# Patient Record
Sex: Male | Born: 1955
Health system: Southern US, Community
[De-identification: ages and names within clinical notes are randomized; demographics above are authoritative.]

## PROBLEM LIST (undated history)

## (undated) DIAGNOSIS — C801 Malignant (primary) neoplasm, unspecified: Secondary | ICD-10-CM

## (undated) DIAGNOSIS — F419 Anxiety disorder, unspecified: Secondary | ICD-10-CM

## (undated) DIAGNOSIS — M6740 Ganglion, unspecified site: Secondary | ICD-10-CM

## (undated) DIAGNOSIS — Z87442 Personal history of urinary calculi: Secondary | ICD-10-CM

## (undated) DIAGNOSIS — F32A Depression, unspecified: Secondary | ICD-10-CM

## (undated) DIAGNOSIS — I10 Essential (primary) hypertension: Secondary | ICD-10-CM

## (undated) DIAGNOSIS — F329 Major depressive disorder, single episode, unspecified: Secondary | ICD-10-CM

## (undated) HISTORY — PX: TONSILLECTOMY: SUR1361

## (undated) HISTORY — PX: FOREARM FRACTURE SURGERY: SHX649

## (undated) HISTORY — PX: PROSTATECTOMY: SHX69

## (undated) HISTORY — PX: COLONOSCOPY: SHX174

## (undated) HISTORY — DX: Malignant (primary) neoplasm, unspecified: C80.1

## (undated) HISTORY — PX: SUBACROMIAL DECOMPRESSION: SHX5174

---

## 2005-02-14 ENCOUNTER — Encounter: Admission: RE | Admit: 2005-02-14 | Discharge: 2005-02-14 | Payer: Self-pay | Admitting: Family Medicine

## 2007-06-03 ENCOUNTER — Emergency Department (HOSPITAL_COMMUNITY): Admission: EM | Admit: 2007-06-03 | Discharge: 2007-06-03 | Payer: Self-pay | Admitting: Emergency Medicine

## 2007-08-06 ENCOUNTER — Ambulatory Visit (HOSPITAL_BASED_OUTPATIENT_CLINIC_OR_DEPARTMENT_OTHER): Admission: RE | Admit: 2007-08-06 | Discharge: 2007-08-06 | Payer: Self-pay | Admitting: Urology

## 2007-12-15 ENCOUNTER — Ambulatory Visit: Payer: Self-pay | Admitting: Internal Medicine

## 2007-12-15 DIAGNOSIS — M549 Dorsalgia, unspecified: Secondary | ICD-10-CM | POA: Insufficient documentation

## 2007-12-15 DIAGNOSIS — M4802 Spinal stenosis, cervical region: Secondary | ICD-10-CM | POA: Insufficient documentation

## 2007-12-17 DIAGNOSIS — Z87442 Personal history of urinary calculi: Secondary | ICD-10-CM | POA: Insufficient documentation

## 2007-12-17 DIAGNOSIS — F329 Major depressive disorder, single episode, unspecified: Secondary | ICD-10-CM

## 2007-12-17 DIAGNOSIS — F3289 Other specified depressive episodes: Secondary | ICD-10-CM | POA: Insufficient documentation

## 2007-12-17 DIAGNOSIS — Z860100 Personal history of colon polyps, unspecified: Secondary | ICD-10-CM | POA: Insufficient documentation

## 2007-12-17 DIAGNOSIS — I10 Essential (primary) hypertension: Secondary | ICD-10-CM | POA: Insufficient documentation

## 2008-01-11 ENCOUNTER — Encounter (INDEPENDENT_AMBULATORY_CARE_PROVIDER_SITE_OTHER): Payer: Self-pay | Admitting: *Deleted

## 2008-10-13 ENCOUNTER — Encounter: Admission: RE | Admit: 2008-10-13 | Discharge: 2008-10-13 | Payer: Self-pay | Admitting: Family Medicine

## 2010-10-06 ENCOUNTER — Encounter: Payer: Self-pay | Admitting: Oncology

## 2010-10-06 ENCOUNTER — Encounter: Payer: Self-pay | Admitting: Family Medicine

## 2011-01-28 NOTE — Op Note (Signed)
NAME:  Adrian Mclaughlin, Adrian Mclaughlin               ACCOUNT NO.:  [id number]   MEDICAL RECORD NO.:  [id number]          PATIENT TYPE:  AMB   LOCATION:  [facility]                         FACILITY:  [facility]   PHYSICIAN:  Sigmund I. Patsi Sears, M.D.DATE OF BIRTH:  12-Jan-1956   DATE OF PROCEDURE:  08/06/2007  DATE OF DISCHARGE:                               OPERATIVE REPORT   RESIDENT:  Dr. Allena Katz.   PREOPERATIVE DIAGNOSIS:  Left ureterovesical junction stone.   POSTOPERATIVE DIAGNOSIS:  Left ureterovesical junction stone.   INDICATIONS:  Adrian Mclaughlin is a 55 year old Caucasian gentleman with a  history of left-sided urolithiasis. CT scan showed approximately 4-mm  left distal UVJ stone. Due to persistent symptoms and not passing the  stone, he is taken back electively today for stone management.   PROCEDURES:  1. Pancystourethroscopy.  2. Retrograde pyelogram on the left.  3. Left extraction of stone.   PROCEDURE IN DETAIL:  The patient is brought back into the operating  room, and after the successful induction of general endotracheal  anesthesia, he was prepped and draped in the usual sterile fashion.  Preoperative time out was performed, and all pressure points were padded  appropriately. We then used a 22-French sheath with 30- and 70-degree  lenses to do pancystourethroscopy. The patient's urethra appeared  normal. There was no evidence of stricture, tumor, foreign body or any  other abnormality. On entering the patient's bladder, both ureteral  orifices were seen at the later aspect of the trigone effluxing clear  urine. The bladder itself appeared unremarkable without any evidence of  tumor, stone, foreign body or any other abnormality. At this point, the  left ureteral orifice was cannulated with a 6-French end-hole catheter.  A retrograde pyelogram was shot. A distal filling defect was seen  consistent with this stone location on CT scan. We then placed a safety  Glidewire in. Then, using the  ureteroscope, we identified the stone. We  were able to grasp it with a nitinol basket. It was then removed in its  entirety from the ureter with some minimal manipulation. After the stone  was withdrawn, it was sent for stone analysis. Then, using a cystoscope,  we were examined the patient's urethra and bladder. As it continued to  appear normal, the Glidewire was removed, the bladder was emptied, and  this ended the procedure.   Dr. Lynelle Smoke I. Patsi Sears was present throughout the entirety of the  case.   ESTIMATED BLOOD LOSS:  Minimal.   URINE OUTPUT:  Unrecorded.   DRAINS:  None.   DISPOSITION:  The patient will go to the PACU for further postoperative  care.      Terie Purser, MD      Sigmund I. Patsi Sears, M.D.  Electronically Signed    JH/MEDQ  D:  08/06/2007  T:  08/07/2007  Job:  [id number]

## 2011-06-24 LAB — I-STAT 8, (EC8 V) (CONVERTED LAB)
Acid-Base Excess: 2
Bicarbonate: 30.6 — ABNORMAL HIGH
Chloride: 104
HCT: 51
Hemoglobin: 17.3 — ABNORMAL HIGH
Operator id: 268271
Sodium: 141
TCO2: 33

## 2011-06-26 LAB — CBC
HCT: 41.2
Hemoglobin: 14.4
Platelets: 217
RBC: 4.92

## 2011-06-26 LAB — BASIC METABOLIC PANEL
CO2: 30
Chloride: 102
Creatinine, Ser: 0.99
GFR calc non Af Amer: >60
Glucose, Bld: 104 — ABNORMAL HIGH
Potassium: 4.6
Sodium: 140

## 2011-06-26 LAB — POCT CARDIAC MARKERS
CKMB, poc: 1.1
Troponin i, poc: <0.05

## 2011-06-26 LAB — DIFFERENTIAL
Eosinophils Absolute: 0
Eosinophils Relative: 0
Monocytes Absolute: 0.4
Monocytes Relative: 4

## 2011-12-25 ENCOUNTER — Other Ambulatory Visit: Payer: Self-pay | Admitting: Gastroenterology

## 2011-12-25 DIAGNOSIS — R110 Nausea: Secondary | ICD-10-CM

## 2011-12-25 DIAGNOSIS — R634 Abnormal weight loss: Secondary | ICD-10-CM

## 2011-12-26 ENCOUNTER — Ambulatory Visit
Admission: RE | Admit: 2011-12-26 | Discharge: 2011-12-26 | Disposition: A | Discharge status: Home / Self Care | Payer: BC Managed Care – PPO | Source: Ambulatory Visit | Attending: Gastroenterology | Admitting: Gastroenterology

## 2011-12-26 DIAGNOSIS — R634 Abnormal weight loss: Secondary | ICD-10-CM

## 2011-12-26 DIAGNOSIS — R110 Nausea: Secondary | ICD-10-CM

## 2012-01-13 ENCOUNTER — Other Ambulatory Visit (HOSPITAL_COMMUNITY): Payer: Self-pay | Admitting: Gastroenterology

## 2012-01-13 DIAGNOSIS — R110 Nausea: Secondary | ICD-10-CM

## 2012-01-19 ENCOUNTER — Other Ambulatory Visit (HOSPITAL_COMMUNITY): Payer: BC Managed Care – PPO

## 2014-11-13 ENCOUNTER — Other Ambulatory Visit: Payer: Self-pay | Admitting: Family Medicine

## 2014-11-13 DIAGNOSIS — M4802 Spinal stenosis, cervical region: Secondary | ICD-10-CM

## 2014-11-17 ENCOUNTER — Ambulatory Visit
Admission: RE | Admit: 2014-11-17 | Discharge: 2014-11-17 | Disposition: A | Discharge status: Home / Self Care | Payer: 59 | Source: Ambulatory Visit | Attending: Family Medicine | Admitting: Family Medicine

## 2014-11-17 DIAGNOSIS — M4802 Spinal stenosis, cervical region: Secondary | ICD-10-CM

## 2016-03-13 ENCOUNTER — Other Ambulatory Visit: Payer: Self-pay | Admitting: Orthopedic Surgery

## 2016-04-09 ENCOUNTER — Encounter (HOSPITAL_BASED_OUTPATIENT_CLINIC_OR_DEPARTMENT_OTHER): Payer: Self-pay | Admitting: *Deleted

## 2016-04-15 ENCOUNTER — Encounter (HOSPITAL_BASED_OUTPATIENT_CLINIC_OR_DEPARTMENT_OTHER): Payer: Self-pay | Admitting: Certified Registered"

## 2016-04-15 ENCOUNTER — Ambulatory Visit (HOSPITAL_BASED_OUTPATIENT_CLINIC_OR_DEPARTMENT_OTHER): Payer: 59 | Admitting: Certified Registered"

## 2016-04-15 ENCOUNTER — Encounter (HOSPITAL_BASED_OUTPATIENT_CLINIC_OR_DEPARTMENT_OTHER)
Admission: RE | Disposition: A | Discharge status: Home / Self Care | Payer: Self-pay | Source: Ambulatory Visit | Attending: Orthopedic Surgery

## 2016-04-15 ENCOUNTER — Ambulatory Visit (HOSPITAL_BASED_OUTPATIENT_CLINIC_OR_DEPARTMENT_OTHER)
Admission: RE | Admit: 2016-04-15 | Discharge: 2016-04-15 | Disposition: A | Discharge status: Home / Self Care | Payer: 59 | Source: Ambulatory Visit | Attending: Orthopedic Surgery | Admitting: Orthopedic Surgery

## 2016-04-15 DIAGNOSIS — M19042 Primary osteoarthritis, left hand: Secondary | ICD-10-CM | POA: Diagnosis not present

## 2016-04-15 DIAGNOSIS — M7138 Other bursal cyst, other site: Secondary | ICD-10-CM | POA: Insufficient documentation

## 2016-04-15 DIAGNOSIS — M6740 Ganglion, unspecified site: Secondary | ICD-10-CM | POA: Diagnosis not present

## 2016-04-15 DIAGNOSIS — F329 Major depressive disorder, single episode, unspecified: Secondary | ICD-10-CM | POA: Insufficient documentation

## 2016-04-15 DIAGNOSIS — F419 Anxiety disorder, unspecified: Secondary | ICD-10-CM | POA: Diagnosis not present

## 2016-04-15 DIAGNOSIS — I10 Essential (primary) hypertension: Secondary | ICD-10-CM | POA: Diagnosis not present

## 2016-04-15 HISTORY — DX: Anxiety disorder, unspecified: F41.9

## 2016-04-15 HISTORY — DX: Major depressive disorder, single episode, unspecified: F32.9

## 2016-04-15 HISTORY — PX: MASS EXCISION: SHX2000

## 2016-04-15 HISTORY — DX: Ganglion, unspecified site: M67.40

## 2016-04-15 HISTORY — DX: Essential (primary) hypertension: I10

## 2016-04-15 HISTORY — DX: Depression, unspecified: F32.A

## 2016-04-15 LAB — POCT I-STAT, CHEM 8
BUN: 21 mg/dL — AB (ref 6–20)
CHLORIDE: 103 mmol/L (ref 101–111)
CREATININE: 0.9 mg/dL (ref 0.61–1.24)
Calcium, Ion: 1.28 mmol/L — ABNORMAL HIGH (ref 1.12–1.23)
GLUCOSE: 92 mg/dL (ref 65–99)
HCT: 38 % — ABNORMAL LOW (ref 39.0–52.0)
HEMOGLOBIN: 12.9 g/dL — AB (ref 13.0–17.0)
POTASSIUM: 4.4 mmol/L (ref 3.5–5.1)
Sodium: 140 mmol/L (ref 135–145)
TCO2: 26 mmol/L (ref 0–100)

## 2016-04-15 SURGERY — EXCISION MASS
Anesthesia: General | Site: Finger | Laterality: Left

## 2016-04-15 MED ORDER — MIDAZOLAM HCL 2 MG/2ML IJ SOLN
1.0000 mg | INTRAMUSCULAR | Status: DC | PRN
  Administered 2016-04-15: 2 mg via INTRAVENOUS

## 2016-04-15 MED ORDER — LIDOCAINE HCL (CARDIAC) 20 MG/ML IV SOLN
INTRAVENOUS | Status: DC | PRN
  Administered 2016-04-15: 60 mg via INTRAVENOUS

## 2016-04-15 MED ORDER — ONDANSETRON HCL 4 MG/2ML IJ SOLN
INTRAMUSCULAR | Status: DC | PRN
  Administered 2016-04-15: 4 mg via INTRAVENOUS

## 2016-04-15 MED ORDER — BUPIVACAINE HCL (PF) 0.25 % IJ SOLN
INTRAMUSCULAR | Status: DC | PRN
  Administered 2016-04-15: 10 mL

## 2016-04-15 MED ORDER — OXYCODONE-ACETAMINOPHEN 5-325 MG PO TABS: ORAL_TABLET | ORAL | 0 refills | Status: DC

## 2016-04-15 MED ORDER — SCOPOLAMINE 1 MG/3DAYS TD PT72: 1.0000 | MEDICATED_PATCH | Freq: Once | TRANSDERMAL | Status: DC | PRN

## 2016-04-15 MED ORDER — LACTATED RINGERS IV SOLN
INTRAVENOUS | Status: DC
  Administered 2016-04-15 (×2): via INTRAVENOUS

## 2016-04-15 MED ORDER — MIDAZOLAM HCL 2 MG/2ML IJ SOLN
INTRAMUSCULAR | Status: AC
  Filled 2016-04-15: qty 2

## 2016-04-15 MED ORDER — DEXAMETHASONE SODIUM PHOSPHATE 10 MG/ML IJ SOLN
INTRAMUSCULAR | Status: DC | PRN
  Administered 2016-04-15: 10 mg via INTRAVENOUS

## 2016-04-15 MED ORDER — CEFAZOLIN SODIUM-DEXTROSE 2-4 GM/100ML-% IV SOLN
2.0000 g | INTRAVENOUS | Status: AC
  Administered 2016-04-15: 2 g via INTRAVENOUS

## 2016-04-15 MED ORDER — CEFAZOLIN SODIUM-DEXTROSE 2-4 GM/100ML-% IV SOLN
INTRAVENOUS | Status: AC
  Filled 2016-04-15: qty 100

## 2016-04-15 MED ORDER — PROPOFOL 10 MG/ML IV BOLUS
INTRAVENOUS | Status: DC | PRN
  Administered 2016-04-15: 200 mg via INTRAVENOUS

## 2016-04-15 MED ORDER — FENTANYL CITRATE (PF) 100 MCG/2ML IJ SOLN
INTRAMUSCULAR | Status: AC
  Filled 2016-04-15: qty 2

## 2016-04-15 MED ORDER — FENTANYL CITRATE (PF) 100 MCG/2ML IJ SOLN
50.0000 ug | INTRAMUSCULAR | Status: DC | PRN
  Administered 2016-04-15 (×2): 50 ug via INTRAVENOUS

## 2016-04-15 MED ORDER — GLYCOPYRROLATE 0.2 MG/ML IJ SOLN: 0.2000 mg | Freq: Once | INTRAMUSCULAR | Status: DC | PRN

## 2016-04-15 MED ORDER — CHLORHEXIDINE GLUCONATE 4 % EX LIQD: 60.0000 mL | Freq: Once | CUTANEOUS | Status: DC

## 2016-04-15 SURGICAL SUPPLY — 64 items
APL SKNCLS STERI-STRIP NONHPOA (GAUZE/BANDAGES/DRESSINGS)
BAG DECANTER FOR FLEXI CONT (MISCELLANEOUS) IMPLANT
BANDAGE ACE 3X5.8 VEL STRL LF (GAUZE/BANDAGES/DRESSINGS) IMPLANT
BANDAGE COBAN STERILE 2 (GAUZE/BANDAGES/DRESSINGS) IMPLANT
BENZOIN TINCTURE PRP APPL 2/3 (GAUZE/BANDAGES/DRESSINGS) IMPLANT
BLADE MINI RND TIP GREEN BEAV (BLADE) IMPLANT
BLADE SURG 15 STRL LF DISP TIS (BLADE) ×4 IMPLANT
BLADE SURG 15 STRL SS (BLADE) ×8
BNDG CMPR 9X4 STRL LF SNTH (GAUZE/BANDAGES/DRESSINGS) ×2
BNDG COHESIVE 1X5 TAN STRL LF (GAUZE/BANDAGES/DRESSINGS) IMPLANT
BNDG CONFORM 2 STRL LF (GAUZE/BANDAGES/DRESSINGS) IMPLANT
BNDG ELASTIC 2X5.8 VLCR STR LF (GAUZE/BANDAGES/DRESSINGS) IMPLANT
BNDG ESMARK 4X9 LF (GAUZE/BANDAGES/DRESSINGS) ×4 IMPLANT
BNDG GAUZE 1X2.1 STRL (MISCELLANEOUS) IMPLANT
BNDG GAUZE ELAST 4 BULKY (GAUZE/BANDAGES/DRESSINGS) IMPLANT
BNDG PLASTER X FAST 3X3 WHT LF (CAST SUPPLIES) IMPLANT
BNDG PLSTR 9X3 FST ST WHT (CAST SUPPLIES)
CHLORAPREP W/TINT 26ML (MISCELLANEOUS) ×4 IMPLANT
CLOSURE WOUND 1/2 X4 (GAUZE/BANDAGES/DRESSINGS)
CORDS BIPOLAR (ELECTRODE) ×4 IMPLANT
COVER BACK TABLE 60X90IN (DRAPES) ×4 IMPLANT
COVER MAYO STAND STRL (DRAPES) ×4 IMPLANT
CUFF TOURNIQUET SINGLE 18IN (TOURNIQUET CUFF) ×4 IMPLANT
DRAPE EXTREMITY T 121X128X90 (DRAPE) ×4 IMPLANT
DRAPE SURG 17X23 STRL (DRAPES) ×4 IMPLANT
GAUZE PACKING IODOFORM 1/4X15 (GAUZE/BANDAGES/DRESSINGS) IMPLANT
GAUZE SPONGE 4X4 12PLY STRL (GAUZE/BANDAGES/DRESSINGS) ×4 IMPLANT
GAUZE XEROFORM 1X8 LF (GAUZE/BANDAGES/DRESSINGS) ×4 IMPLANT
GLOVE BIO SURGEON STRL SZ7.5 (GLOVE) ×7 IMPLANT
GLOVE BIOGEL PI IND STRL 7.0 (GLOVE) ×4 IMPLANT
GLOVE BIOGEL PI IND STRL 8 (GLOVE) ×3 IMPLANT
GLOVE BIOGEL PI INDICATOR 7.0 (GLOVE) ×4
GLOVE BIOGEL PI INDICATOR 8 (GLOVE) ×4
GOWN STRL REUS W/ TWL LRG LVL3 (GOWN DISPOSABLE) ×3 IMPLANT
GOWN STRL REUS W/TWL LRG LVL3 (GOWN DISPOSABLE) ×8
GOWN STRL REUS W/TWL XL LVL3 (GOWN DISPOSABLE) ×7 IMPLANT
LOOP VESSEL MAXI BLUE (MISCELLANEOUS) IMPLANT
NDL HYPO 25X1 1.5 SAFETY (NEEDLE) ×1 IMPLANT
NEEDLE HYPO 25X1 1.5 SAFETY (NEEDLE) ×4 IMPLANT
NS IRRIG 1000ML POUR BTL (IV SOLUTION) ×4 IMPLANT
PACK BASIN DAY SURGERY FS (CUSTOM PROCEDURE TRAY) ×4 IMPLANT
PAD CAST 3X4 CTTN HI CHSV (CAST SUPPLIES) IMPLANT
PAD CAST 4YDX4 CTTN HI CHSV (CAST SUPPLIES) IMPLANT
PADDING CAST ABS 4INX4YD NS (CAST SUPPLIES) ×2
PADDING CAST ABS COTTON 4X4 ST (CAST SUPPLIES) ×2 IMPLANT
PADDING CAST COTTON 3X4 STRL (CAST SUPPLIES)
PADDING CAST COTTON 4X4 STRL (CAST SUPPLIES)
SPLINT FINGER 3.25 911903 (SOFTGOODS) ×3 IMPLANT
SPLINT PLASTER CAST XFAST 3X15 (CAST SUPPLIES) IMPLANT
SPLINT PLASTER XTRA FASTSET 3X (CAST SUPPLIES)
STOCKINETTE 4X48 STRL (DRAPES) ×4 IMPLANT
STRIP CLOSURE SKIN 1/2X4 (GAUZE/BANDAGES/DRESSINGS) IMPLANT
SUT ETHILON 3 0 PS 1 (SUTURE) IMPLANT
SUT ETHILON 4 0 PS 2 18 (SUTURE) ×4 IMPLANT
SUT ETHILON 5 0 P 3 18 (SUTURE)
SUT NYLON ETHILON 5-0 P-3 1X18 (SUTURE) IMPLANT
SUT VIC AB 4-0 P2 18 (SUTURE) IMPLANT
SWAB COLLECTION DEVICE MRSA (MISCELLANEOUS) IMPLANT
SWAB CULTURE ESWAB REG 1ML (MISCELLANEOUS) IMPLANT
SYR BULB 3OZ (MISCELLANEOUS) ×4 IMPLANT
SYR CONTROL 10ML LL (SYRINGE) ×4 IMPLANT
TOWEL OR 17X24 6PK STRL BLUE (TOWEL DISPOSABLE) ×8 IMPLANT
TUBE FEEDING 5FR 15 INCH (TUBING) IMPLANT
UNDERPAD 30X30 (UNDERPADS AND DIAPERS) ×4 IMPLANT

## 2016-04-15 NOTE — Anesthesia Preprocedure Evaluation (Addendum)
Anesthesia Evaluation  Patient identified by MRN, date of birth, ID band Patient awake    Reviewed: Allergy & Precautions, NPO status , Patient's Chart, lab work & pertinent test results  Airway Mallampati: I  TM Distance: >3 FB Neck ROM: Full    Dental  (+) Teeth Intact, Dental Advisory Given   Pulmonary    breath sounds clear to auscultation       Cardiovascular hypertension, Pt. on medications  Rhythm:Regular Rate:Normal     Neuro/Psych PSYCHIATRIC DISORDERS Anxiety Depression    GI/Hepatic   Endo/Other    Renal/GU      Musculoskeletal   Abdominal   Peds  Hematology   Anesthesia Other Findings   Reproductive/Obstetrics                            Anesthesia Physical Anesthesia Plan  ASA: II  Anesthesia Plan: General   Post-op Pain Management:    Induction: Intravenous  Airway Management Planned: LMA  Additional Equipment:   Intra-op Plan:   Post-operative Plan: Extubation in OR  Informed Consent: I have reviewed the patients History and Physical, chart, labs and discussed the procedure including the risks, benefits and alternatives for the proposed anesthesia with the patient or authorized representative who has indicated his/her understanding and acceptance.   Dental advisory given  Plan Discussed with: CRNA, Anesthesiologist and Surgeon  Anesthesia Plan Comments:         Anesthesia Quick Evaluation

## 2016-04-15 NOTE — Discharge Instructions (Addendum)

## 2016-04-15 NOTE — H&P (Signed)
  Adrian Mclaughlin is an 60 y.o. male.   Chief Complaint: left long finger mucoid cyst HPI: 60 yo rhd male with 4 month history of mass on left long finger.  It is bothersome to him.  He wishes to have it removed.    Allergies: No Known Allergies  Past Medical History:  Diagnosis Date  . Anxiety   . Depression   . Hypertension   . Mucoid cyst of joint    left long finger    Past Surgical History:  Procedure Laterality Date  . FOREARM FRACTURE SURGERY Left   . TONSILLECTOMY      Family History: History reviewed. No pertinent family history.  Social History:   reports that he has never smoked. He has never used smokeless tobacco. He reports that he drinks alcohol. His drug history is not on file.  Medications: Medications Prior to Admission  Medication Sig Dispense Refill  . lamoTRIgine (LAMICTAL) 150 MG tablet Take 150 mg by mouth daily.    Marland Kitchen PARoxetine (PAXIL) 40 MG tablet Take 40 mg by mouth every morning.    Marland Kitchen QUEtiapine (SEROQUEL) 200 MG tablet Take 200 mg by mouth at bedtime.    . quinapril-hydrochlorothiazide (ACCURETIC) 20-12.5 MG tablet Take 1 tablet by mouth daily.      Results for orders placed or performed during the hospital encounter of 04/15/16 (from the past 48 hour(s))  I-STAT, chem 8     Status: Abnormal   Collection Time: 04/15/16 11:39 AM  Result Value Ref Range   Sodium 140 135 - 145 mmol/L   Potassium 4.4 3.5 - 5.1 mmol/L   Chloride 103 101 - 111 mmol/L   BUN 21 (H) 6 - 20 mg/dL   Creatinine, Ser 0.90 0.61 - 1.24 mg/dL   Glucose, Bld 92 65 - 99 mg/dL   Calcium, Ion 1.28 (H) 1.12 - 1.23 mmol/L   TCO2 26 0 - 100 mmol/L   Hemoglobin 12.9 (L) 13.0 - 17.0 g/dL   HCT 38.0 (L) 39.0 - 52.0 %    No results found.   A comprehensive review of systems was negative.  Blood pressure 127/72, pulse 70, temperature 97.8 F (36.6 C), temperature source Oral, resp. rate 18, height 5\' 11"  (1.803 m), weight 82.1 kg (181 lb), SpO2 100 %.  General appearance:  alert, cooperative and appears stated age Head: Normocephalic, without obvious abnormality, atraumatic Neck: supple, symmetrical, trachea midline Resp: clear to auscultation bilaterally Cardio: regular rate and rhythm GI: non-tender Extremities: Intact sensation and capillary refill all digits.  +epl/fpl/io.  No wounds.  Pulses: 2+ and symmetric Skin: Skin color, texture, turgor normal. No rashes or lesions Neurologic: Grossly normal Incision/Wound:none  Assessment/Plan Left long finger mucoid cyst and dip arthritis.  Non operative and operative treatment options were discussed with the patient and patient wishes to proceed with operative treatment.  We discussed the possible need for a rotation flap for skin coverage.  Risks, benefits, and alternatives of surgery were discussed and the patient agrees with the plan of care.  Cristianna Cyr R 04/15/2016, 12:42 PM

## 2016-04-15 NOTE — Op Note (Signed)
[id number] 

## 2016-04-15 NOTE — Op Note (Signed)
NAME:  Adrian Mclaughlin, Adrian Mclaughlin NO.:  [id number]  MEDICAL RECORD NO.:  [id number]  LOCATION:                                 FACILITY:  PHYSICIAN:  Leanora Cover, MD             DATE OF BIRTH:  DATE OF PROCEDURE:  04/15/2016 DATE OF DISCHARGE:                              OPERATIVE REPORT   PREOPERATIVE DIAGNOSES:  Left long finger mucoid cyst and DIP arthritis.  POSTOPERATIVE DIAGNOSES:  Left long finger mucoid cyst, DIP joint arthritis and loose bodies.  PROCEDURE:   1. Left long finger excision of mucoid cyst 2. Debridement of DIP joint including removal of 6 loose bodies and removal of osteophyte.  SURGEON:  Leanora Cover, MD.  ASSISTANT:  None.  ANESTHESIA:  General.  IV FLUIDS:  Per anesthesia flow sheet.  ESTIMATED BLOOD LOSS:  Minimal.  COMPLICATIONS:  None.  SPECIMENS:  Cyst to Pathology.  TOURNIQUET TIME:  19 minutes.  DISPOSITION:  Stable to PACU.  INDICATIONS:  Adrian Mclaughlin is a 60 year old male with a mass in his left long finger for approximately 4 months.  This is bothersome to him.  He wished to have it removed.  Risks, benefits, and alternatives of surgery were discussed including risk of blood loss; infection; damage to nerves, vessels, tendons, ligaments, bone; failure of surgery; need for additional surgery; complications with wound healing; continued pain; recurrence of mass.  He voiced understanding of these risks and elected to proceed.  OPERATIVE COURSE:  After being identified preoperatively by myself, the patient and I agreed upon procedure and site of procedure.  Surgical site was marked.  The risks, benefits, and alternatives of surgery were reviewed and he wished to proceed.  Surgical consent had been signed. He was given IV Ancef as preoperative antibiotic prophylaxis.  The patient was transferred to the operating room, placed on the operating room table in supine position with left upper extremity on arm board. General  anesthesia was induced by anesthesiologist.  Left upper extremity was prepped and draped in normal sterile orthopedic fashion. Surgical pause was performed between surgeons, anesthesia, operating staff, and all were in agreement as to the patient, procedure, and site of procedure.  Tourniquet at the proximal aspect of the extremity was inflated to 250 mmHg after exsanguination of the limb with an Esmarch bandage.  A hockey-stick shaped incision was made at the radial side of the finger at the DIP joint.  This was carried into subcutaneous tissues by spreading technique.  Bipolar electrocautery was used as necessary for hemostasis.  The mass was removed and sent to Pathology for examination.  The DIP joint was entered underneath the extensor tendon. It was debrided with the synovectomy rongeurs.  Prominent osteophyte dorsally was taken down.  There were approximately 6 well-rounded loose bodies in the joint, which were also removed.  The wound and joint were copiously irrigated with sterile saline.  The thin skin on the dorsum of the finger, where the mass was excised in elliptical fashion. Approximately 1-2 mm of skin was excised in a smaller diameter.  A 5-0 nylon suture was then used to reapproximate all skin edges.  Good reapproximation was obtained.  A digital block was performed with 10 mL of 0.25% plain Marcaine to aid in postoperative analgesia.  The wound was dressed with sterile Xeroform, 4 x 4 and wrapped with a Coban dressing lightly.  An AlumaFoam splint was placed and wrapped with Coban dressing lightly.  Tourniquet deflated at 19 minutes.  Fingertips were pink with brisk capillary refill after deflation of tourniquet. Operative drapes were broken down.  The patient was awoken from anesthesia safely.  He was transferred back to stretcher and taken to PACU in stable condition.  I will see him back in the office in 1 week for postoperative followup.  I will give him Percocet  5/325, 1-2 p.o. q.6 hours p.r.n. pain, dispensed #20.     Leanora Cover, MD     KK/MEDQ  D:  04/15/2016  T:  04/15/2016  Job:  [id number]

## 2016-04-15 NOTE — Anesthesia Procedure Notes (Signed)
Procedure Name: LMA Insertion Date/Time: 04/15/2016 12:55 PM Performed by: Kyzen Horn D Pre-anesthesia Checklist: Patient identified, Emergency Drugs available, Suction available and Patient being monitored Patient Re-evaluated:Patient Re-evaluated prior to inductionOxygen Delivery Method: Circle system utilized Preoxygenation: Pre-oxygenation with 100% oxygen Intubation Type: IV induction Ventilation: Mask ventilation without difficulty LMA: LMA inserted LMA Size: 5.0 Number of attempts: 1 Airway Equipment and Method: Bite block Placement Confirmation: positive ETCO2 Tube secured with: Tape Dental Injury: Teeth and Oropharynx as per pre-operative assessment

## 2016-04-15 NOTE — Transfer of Care (Signed)
Immediate Anesthesia Transfer of Care Note  Patient: Adrian Mclaughlin  Procedure(s) Performed: Procedure(s): LEFT LONG FINGER EXCISION MASS (Left)  Patient Location: PACU  Anesthesia Type:General  Level of Consciousness: awake and patient cooperative  Airway & Oxygen Therapy: Patient Spontanous Breathing and Patient connected to face mask oxygen  Post-op Assessment: Report given to RN and Post -op Vital signs reviewed and stable  Post vital signs: Reviewed and stable  Last Vitals:  Vitals:   04/15/16 1126 04/15/16 1332  BP: 127/72 99/63  Pulse: 70 72  Resp: 18   Temp: 36.6 C     Last Pain:  Vitals:   04/15/16 1126  TempSrc: Oral  PainSc: 4       Patients Stated Pain Goal: 1 ([id number])  Complications: No apparent anesthesia complications

## 2016-04-15 NOTE — Brief Op Note (Signed)
04/15/2016  1:39 PM  PATIENT:  Adrian Mclaughlin  60 y.o. male  PRE-OPERATIVE DIAGNOSIS:  left long finger mucoid cyst and Distal Interphalangeal Arthritis    M67.40  POST-OPERATIVE DIAGNOSIS:  left long finger mucoid cyst and distal interphangeal arthritis  PROCEDURE:  Procedure(s): LEFT LONG FINGER EXCISION MASS (Left)  SURGEON:  Surgeon(s) and Role:    * Leanora Cover, MD - Primary  PHYSICIAN ASSISTANT:   ASSISTANTS: none   ANESTHESIA:   general  EBL:  Total I/O In: 1000 [I.V.:1000] Out: -   BLOOD ADMINISTERED:none  DRAINS: none   LOCAL MEDICATIONS USED:  MARCAINE     SPECIMEN:  Source of Specimen:  left long finger  DISPOSITION OF SPECIMEN:  PATHOLOGY  COUNTS:  YES  TOURNIQUET:   Total Tourniquet Time Documented: Upper Arm (Left) - 19 minutes Total: Upper Arm (Left) - 19 minutes   DICTATION: .Other Dictation: Dictation Number [id number]  PLAN OF CARE: Discharge to home after PACU  PATIENT DISPOSITION:  PACU - hemodynamically stable.

## 2016-04-15 NOTE — Anesthesia Postprocedure Evaluation (Signed)
Anesthesia Post Note  Patient: Adrian Mclaughlin  Procedure(s) Performed: Procedure(s) (LRB): LEFT LONG FINGER EXCISION MASS (Left)  Patient location during evaluation: PACU Anesthesia Type: General Level of consciousness: awake and alert Pain management: pain level controlled Vital Signs Assessment: post-procedure vital signs reviewed and stable Respiratory status: spontaneous breathing, nonlabored ventilation and respiratory function stable Cardiovascular status: blood pressure returned to baseline and stable Postop Assessment: no signs of nausea or vomiting Anesthetic complications: no    Last Vitals:  Vitals:   04/15/16 1400 04/15/16 1445  BP: 134/78 135/80  Pulse: 72 66  Resp: 15 20  Temp:  36.6 C    Last Pain:  Vitals:   04/15/16 1430  TempSrc:   PainSc: 0-No pain                 Hadessah Grennan A

## 2016-04-17 ENCOUNTER — Encounter (HOSPITAL_BASED_OUTPATIENT_CLINIC_OR_DEPARTMENT_OTHER): Payer: Self-pay | Admitting: Orthopedic Surgery

## 2017-03-03 DIAGNOSIS — M4712 Other spondylosis with myelopathy, cervical region: Secondary | ICD-10-CM | POA: Diagnosis not present

## 2017-03-03 DIAGNOSIS — M5000 Cervical disc disorder with myelopathy, unspecified cervical region: Secondary | ICD-10-CM | POA: Diagnosis not present

## 2017-03-03 DIAGNOSIS — M5030 Other cervical disc degeneration, unspecified cervical region: Secondary | ICD-10-CM | POA: Diagnosis not present

## 2017-03-04 DIAGNOSIS — R197 Diarrhea, unspecified: Secondary | ICD-10-CM | POA: Diagnosis not present

## 2017-03-06 DIAGNOSIS — R197 Diarrhea, unspecified: Secondary | ICD-10-CM | POA: Diagnosis not present

## 2017-03-13 DIAGNOSIS — R1084 Generalized abdominal pain: Secondary | ICD-10-CM | POA: Diagnosis not present

## 2017-03-13 DIAGNOSIS — R197 Diarrhea, unspecified: Secondary | ICD-10-CM | POA: Diagnosis not present

## 2017-03-23 DIAGNOSIS — R197 Diarrhea, unspecified: Secondary | ICD-10-CM | POA: Diagnosis not present

## 2017-04-13 DIAGNOSIS — R7989 Other specified abnormal findings of blood chemistry: Secondary | ICD-10-CM | POA: Diagnosis not present

## 2017-04-20 DIAGNOSIS — I10 Essential (primary) hypertension: Secondary | ICD-10-CM | POA: Diagnosis not present

## 2017-04-23 DIAGNOSIS — R1084 Generalized abdominal pain: Secondary | ICD-10-CM | POA: Diagnosis not present

## 2017-04-23 DIAGNOSIS — K640 First degree hemorrhoids: Secondary | ICD-10-CM | POA: Diagnosis not present

## 2017-04-23 DIAGNOSIS — R197 Diarrhea, unspecified: Secondary | ICD-10-CM | POA: Diagnosis not present

## 2017-06-19 DIAGNOSIS — R21 Rash and other nonspecific skin eruption: Secondary | ICD-10-CM | POA: Diagnosis not present

## 2017-06-30 DIAGNOSIS — R21 Rash and other nonspecific skin eruption: Secondary | ICD-10-CM | POA: Diagnosis not present

## 2017-10-24 DIAGNOSIS — R5383 Other fatigue: Secondary | ICD-10-CM | POA: Diagnosis not present

## 2017-10-24 DIAGNOSIS — I10 Essential (primary) hypertension: Secondary | ICD-10-CM | POA: Diagnosis not present

## 2017-10-24 DIAGNOSIS — R5381 Other malaise: Secondary | ICD-10-CM | POA: Diagnosis not present

## 2017-11-04 DIAGNOSIS — M533 Sacrococcygeal disorders, not elsewhere classified: Secondary | ICD-10-CM | POA: Diagnosis not present

## 2017-11-09 DIAGNOSIS — Z13220 Encounter for screening for lipoid disorders: Secondary | ICD-10-CM | POA: Diagnosis not present

## 2017-11-09 DIAGNOSIS — Z0000 Encounter for general adult medical examination without abnormal findings: Secondary | ICD-10-CM | POA: Diagnosis not present

## 2017-12-14 DIAGNOSIS — R972 Elevated prostate specific antigen [PSA]: Secondary | ICD-10-CM | POA: Diagnosis not present

## 2017-12-21 DIAGNOSIS — M5030 Other cervical disc degeneration, unspecified cervical region: Secondary | ICD-10-CM | POA: Diagnosis not present

## 2017-12-21 DIAGNOSIS — M542 Cervicalgia: Secondary | ICD-10-CM | POA: Diagnosis not present

## 2017-12-21 DIAGNOSIS — M4722 Other spondylosis with radiculopathy, cervical region: Secondary | ICD-10-CM | POA: Diagnosis not present

## 2017-12-21 DIAGNOSIS — M5020 Other cervical disc displacement, unspecified cervical region: Secondary | ICD-10-CM | POA: Diagnosis not present

## 2017-12-24 DIAGNOSIS — M4722 Other spondylosis with radiculopathy, cervical region: Secondary | ICD-10-CM | POA: Diagnosis not present

## 2017-12-24 DIAGNOSIS — M4802 Spinal stenosis, cervical region: Secondary | ICD-10-CM | POA: Diagnosis not present

## 2018-01-18 ENCOUNTER — Other Ambulatory Visit: Payer: Self-pay | Admitting: Neurosurgery

## 2018-01-18 DIAGNOSIS — M5000 Cervical disc disorder with myelopathy, unspecified cervical region: Secondary | ICD-10-CM | POA: Diagnosis not present

## 2018-01-18 DIAGNOSIS — M4712 Other spondylosis with myelopathy, cervical region: Secondary | ICD-10-CM | POA: Diagnosis not present

## 2018-01-18 DIAGNOSIS — M5030 Other cervical disc degeneration, unspecified cervical region: Secondary | ICD-10-CM | POA: Diagnosis not present

## 2018-02-02 NOTE — Pre-Procedure Instructions (Signed)
Adrian Mclaughlin  02/02/2018      [id number] - [place], [place] - [address] [place] [address] Phone: [phone] Fax: [phone]    Your procedure is scheduled on Thurs. May 30  Report to [facility] Admitting at 8:40  A.M.  Call this number if you have problems the morning of surgery:  [phone]   Remember:   No food or liquids after midnight.                       Take these medicines the morning of surgery with A SIP OF WATER : paroxetine (paxil), lamotrigine (lamictal XR)             7 days prior to surgery STOP taking any Aspirin(unless otherwise instructed by your surgeon), Aleve, Naproxen, Ibuprofen, Motrin, Advil, Goody's, BC's, all herbal medications, fish oil, and all vitamins    Do not wear jewelry.  Do not wear lotions, powders, or perfumes, or deodorant.  Do not shave 48 hours prior to surgery.  Men may shave face and neck.  Do not bring valuables to the hospital.  [facility] is not responsible for any belongings or valuables.  Contacts, dentures or bridgework may not be worn into surgery.  Leave your suitcase in the car.  After surgery it may be brought to your room.  For patients admitted to the hospital, discharge time will be determined by your treatment team.  Patients discharged the day of surgery will not be allowed to drive home.    Special instructions:  [facility]- Preparing For Surgery  Before surgery, you can play an important role. Because skin is not sterile, your skin needs to be as free of germs as possible. You can reduce the number of germs on your skin by washing with CHG (chlorahexidine gluconate) Soap before surgery.  CHG is an antiseptic cleaner which kills germs and bonds with the skin to continue killing germs even after washing.    Oral Hygiene is also important to reduce your risk of infection.  Remember - BRUSH YOUR TEETH THE MORNING OF SURGERY WITH YOUR  REGULAR TOOTHPASTE  Please do not use if you have an allergy to CHG or antibacterial soaps. If your skin becomes reddened/irritated stop using the CHG.  Do not shave (including legs and underarms) for at least 48 hours prior to first CHG shower. It is OK to shave your face.  Please follow these instructions carefully.   1. Shower the NIGHT BEFORE SURGERY and the MORNING OF SURGERY with CHG.   2. If you chose to wash your hair, wash your hair first as usual with your normal shampoo.  3. After you shampoo, rinse your hair and body thoroughly to remove the shampoo.  4. Use CHG as you would any other liquid soap. You can apply CHG directly to the skin and wash gently with a scrungie or a clean washcloth.   5. Apply the CHG Soap to your body ONLY FROM THE NECK DOWN.  Do not use on open wounds or open sores. Avoid contact with your eyes, ears, mouth and genitals (private parts). Wash Face and genitals (private parts)  with your normal soap.  6. Wash thoroughly, paying special attention to the area where your surgery will be performed.  7. Thoroughly rinse your body with warm water from the neck down.  8. DO NOT shower/wash with your normal  soap after using and rinsing off the CHG Soap.  9. Pat yourself dry with a CLEAN TOWEL.  10. Wear CLEAN PAJAMAS to bed the night before surgery, wear comfortable clothes the morning of surgery  11. Place CLEAN SHEETS on your bed the night of your first shower and DO NOT SLEEP WITH PETS.    Day of Surgery:  Do not apply any deodorants/lotions.  Please wear clean clothes to the hospital/surgery center.   Remember to brush your teeth WITH YOUR REGULAR TOOTHPASTE.    Please read over the following fact sheets that you were given. Coughing and Deep Breathing, MRSA Information and Surgical Site Infection Prevention

## 2018-02-03 ENCOUNTER — Encounter (HOSPITAL_COMMUNITY)
Admission: RE | Admit: 2018-02-03 | Discharge: 2018-02-03 | Disposition: A | Discharge status: Home / Self Care | Payer: 59 | Source: Ambulatory Visit | Attending: Neurosurgery | Admitting: Neurosurgery

## 2018-02-03 ENCOUNTER — Other Ambulatory Visit: Payer: Self-pay

## 2018-02-03 ENCOUNTER — Encounter (HOSPITAL_COMMUNITY): Payer: Self-pay

## 2018-02-03 DIAGNOSIS — Z0183 Encounter for blood typing: Secondary | ICD-10-CM | POA: Diagnosis not present

## 2018-02-03 DIAGNOSIS — Z01812 Encounter for preprocedural laboratory examination: Secondary | ICD-10-CM | POA: Diagnosis not present

## 2018-02-03 DIAGNOSIS — I10 Essential (primary) hypertension: Secondary | ICD-10-CM | POA: Diagnosis not present

## 2018-02-03 DIAGNOSIS — Z01818 Encounter for other preprocedural examination: Secondary | ICD-10-CM | POA: Insufficient documentation

## 2018-02-03 HISTORY — DX: Personal history of urinary calculi: Z87.442

## 2018-02-03 LAB — SURGICAL PCR SCREEN
MRSA, PCR: NEGATIVE
Staphylococcus aureus: NEGATIVE

## 2018-02-03 LAB — BASIC METABOLIC PANEL
ANION GAP: 7 (ref 5–15)
BUN: 12 mg/dL (ref 6–20)
CO2: 30 mmol/L (ref 22–32)
Calcium: 10.7 mg/dL — ABNORMAL HIGH (ref 8.9–10.3)
Chloride: 100 mmol/L — ABNORMAL LOW (ref 101–111)
Creatinine, Ser: 0.97 mg/dL (ref 0.61–1.24)
GFR calc Af Amer: >60 mL/min (ref >60)
GFR calc non Af Amer: >60 mL/min (ref >60)
GLUCOSE: 89 mg/dL (ref 65–99)
Potassium: 4.4 mmol/L (ref 3.5–5.1)
Sodium: 137 mmol/L (ref 135–145)

## 2018-02-03 LAB — CBC
HEMATOCRIT: 44.7 % (ref 39.0–52.0)
Hemoglobin: 14.8 g/dL (ref 13.0–17.0)
MCH: 28.2 pg (ref 26.0–34.0)
MCHC: 33.1 g/dL (ref 30.0–36.0)
MCV: 85.1 fL (ref 78.0–100.0)
Platelets: 238 10*3/uL (ref 150–400)
RBC: 5.25 MIL/uL (ref 4.22–5.81)
RDW: 12.7 % (ref 11.5–15.5)
WBC: 5.3 10*3/uL (ref 4.0–10.5)

## 2018-02-03 LAB — TYPE AND SCREEN
ABO/RH(D): O NEG
Antibody Screen: NEGATIVE

## 2018-02-03 LAB — ABO/RH: ABO/RH(D): O NEG

## 2018-02-03 NOTE — Progress Notes (Signed)
PCP - Dr. Marjean Donna Cardiologist - patient denies  Chest x-ray - n/a EKG - 02/03/2018 Stress Test - patient denies ECHO - patient denies Cardiac Cath - patient denies  Sleep Study - patient denies  Anesthesia review: n/a  Patient denies shortness of breath, fever, cough and chest pain at PAT appointment   Patient verbalized understanding of instructions that were given to them at the PAT appointment. Patient was also instructed that they will need to review over the PAT instructions again at home before surgery.

## 2018-02-04 ENCOUNTER — Other Ambulatory Visit: Payer: Self-pay | Admitting: Neurosurgery

## 2018-02-11 ENCOUNTER — Ambulatory Visit (HOSPITAL_COMMUNITY): Payer: 59 | Admitting: Anesthesiology

## 2018-02-11 ENCOUNTER — Encounter (HOSPITAL_COMMUNITY): Payer: Self-pay | Admitting: *Deleted

## 2018-02-11 ENCOUNTER — Observation Stay (HOSPITAL_COMMUNITY)
Admission: RE | Admit: 2018-02-11 | Discharge: 2018-02-12 | Disposition: A | Discharge status: Home / Self Care | Payer: 59 | Source: Ambulatory Visit | Attending: Neurosurgery | Admitting: Neurosurgery

## 2018-02-11 ENCOUNTER — Ambulatory Visit (HOSPITAL_COMMUNITY): Payer: 59

## 2018-02-11 ENCOUNTER — Encounter (HOSPITAL_COMMUNITY)
Admission: RE | Disposition: A | Discharge status: Home / Self Care | Payer: Self-pay | Source: Ambulatory Visit | Attending: Neurosurgery

## 2018-02-11 DIAGNOSIS — M5000 Cervical disc disorder with myelopathy, unspecified cervical region: Secondary | ICD-10-CM | POA: Diagnosis present

## 2018-02-11 DIAGNOSIS — Z79899 Other long term (current) drug therapy: Secondary | ICD-10-CM | POA: Diagnosis not present

## 2018-02-11 DIAGNOSIS — Z419 Encounter for procedure for purposes other than remedying health state, unspecified: Secondary | ICD-10-CM

## 2018-02-11 DIAGNOSIS — M50021 Cervical disc disorder at C4-C5 level with myelopathy: Principal | ICD-10-CM | POA: Insufficient documentation

## 2018-02-11 DIAGNOSIS — M4712 Other spondylosis with myelopathy, cervical region: Secondary | ICD-10-CM | POA: Insufficient documentation

## 2018-02-11 DIAGNOSIS — M4322 Fusion of spine, cervical region: Secondary | ICD-10-CM | POA: Diagnosis not present

## 2018-02-11 DIAGNOSIS — I10 Essential (primary) hypertension: Secondary | ICD-10-CM | POA: Insufficient documentation

## 2018-02-11 DIAGNOSIS — M5001 Cervical disc disorder with myelopathy,  high cervical region: Secondary | ICD-10-CM | POA: Diagnosis not present

## 2018-02-11 DIAGNOSIS — M5002 Cervical disc disorder with myelopathy, mid-cervical region, unspecified level: Secondary | ICD-10-CM | POA: Diagnosis not present

## 2018-02-11 DIAGNOSIS — M4722 Other spondylosis with radiculopathy, cervical region: Secondary | ICD-10-CM | POA: Diagnosis not present

## 2018-02-11 DIAGNOSIS — F329 Major depressive disorder, single episode, unspecified: Secondary | ICD-10-CM | POA: Insufficient documentation

## 2018-02-11 HISTORY — PX: ANTERIOR CERVICAL DECOMP/DISCECTOMY FUSION: SHX1161

## 2018-02-11 SURGERY — ANTERIOR CERVICAL DECOMPRESSION/DISCECTOMY FUSION 2 LEVELS
Anesthesia: General

## 2018-02-11 MED ORDER — MAGNESIUM HYDROXIDE 400 MG/5ML PO SUSP: 30.0000 mL | Freq: Every day | ORAL | Status: DC | PRN

## 2018-02-11 MED ORDER — SODIUM CHLORIDE 0.9% FLUSH
3.0000 mL | Freq: Two times a day (BID) | INTRAVENOUS | Status: DC
  Administered 2018-02-11: 3 mL via INTRAVENOUS

## 2018-02-11 MED ORDER — LISINOPRIL 20 MG PO TABS
20.0000 mg | ORAL_TABLET | Freq: Every day | ORAL | Status: DC
  Administered 2018-02-11 – 2018-02-12 (×2): 20 mg via ORAL
  Filled 2018-02-11 (×2): qty 1

## 2018-02-11 MED ORDER — FENTANYL CITRATE (PF) 100 MCG/2ML IJ SOLN
25.0000 ug | INTRAMUSCULAR | Status: DC | PRN
  Administered 2018-02-11 (×2): 50 ug via INTRAVENOUS

## 2018-02-11 MED ORDER — ROCURONIUM BROMIDE 50 MG/5ML IV SOLN
INTRAVENOUS | Status: AC
  Filled 2018-02-11: qty 2

## 2018-02-11 MED ORDER — CHLORHEXIDINE GLUCONATE CLOTH 2 % EX PADS: 6.0000 | MEDICATED_PAD | Freq: Once | CUTANEOUS | Status: DC

## 2018-02-11 MED ORDER — LIDOCAINE-EPINEPHRINE 1 %-1:100000 IJ SOLN
INTRAMUSCULAR | Status: AC
  Filled 2018-02-11: qty 1

## 2018-02-11 MED ORDER — SUGAMMADEX SODIUM 200 MG/2ML IV SOLN
INTRAVENOUS | Status: DC | PRN
Start: 2018-02-11 — End: 2018-02-11
  Administered 2018-02-11: 200 mg via INTRAVENOUS

## 2018-02-11 MED ORDER — SUCCINYLCHOLINE CHLORIDE 200 MG/10ML IV SOSY
PREFILLED_SYRINGE | INTRAVENOUS | Status: AC
  Filled 2018-02-11: qty 10

## 2018-02-11 MED ORDER — OXYCODONE HCL 5 MG/5ML PO SOLN: 5.0000 mg | Freq: Once | ORAL | Status: AC | PRN

## 2018-02-11 MED ORDER — PHENYLEPHRINE 40 MCG/ML (10ML) SYRINGE FOR IV PUSH (FOR BLOOD PRESSURE SUPPORT)
PREFILLED_SYRINGE | INTRAVENOUS | Status: AC
  Filled 2018-02-11: qty 20

## 2018-02-11 MED ORDER — ROCURONIUM BROMIDE 100 MG/10ML IV SOLN
INTRAVENOUS | Status: DC | PRN
  Administered 2018-02-11: 30 mg via INTRAVENOUS
  Administered 2018-02-11: 10 mg via INTRAVENOUS
  Administered 2018-02-11: 50 mg via INTRAVENOUS
  Administered 2018-02-11: 10 mg via INTRAVENOUS

## 2018-02-11 MED ORDER — QUETIAPINE FUMARATE 200 MG PO TABS
200.0000 mg | ORAL_TABLET | Freq: Every day | ORAL | Status: DC
  Administered 2018-02-11: 200 mg via ORAL
  Filled 2018-02-11 (×2): qty 1

## 2018-02-11 MED ORDER — SUGAMMADEX SODIUM 200 MG/2ML IV SOLN
INTRAVENOUS | Status: AC
  Filled 2018-02-11: qty 2

## 2018-02-11 MED ORDER — SODIUM CHLORIDE 0.9 % IV SOLN
INTRAVENOUS | Status: DC | PRN
  Administered 2018-02-11: 11:00:00

## 2018-02-11 MED ORDER — BUPIVACAINE HCL (PF) 0.5 % IJ SOLN
INTRAMUSCULAR | Status: DC | PRN
  Administered 2018-02-11: 5 mL

## 2018-02-11 MED ORDER — HYDROCHLOROTHIAZIDE 12.5 MG PO CAPS
12.5000 mg | ORAL_CAPSULE | Freq: Every day | ORAL | Status: DC
  Administered 2018-02-11 – 2018-02-12 (×2): 12.5 mg via ORAL
  Filled 2018-02-11 (×2): qty 1

## 2018-02-11 MED ORDER — THROMBIN 5000 UNITS EX SOLR
CUTANEOUS | Status: AC
  Filled 2018-02-11: qty 15000

## 2018-02-11 MED ORDER — QUINAPRIL-HYDROCHLOROTHIAZIDE 20-12.5 MG PO TABS: 1.0000 | ORAL_TABLET | Freq: Every day | ORAL | Status: DC

## 2018-02-11 MED ORDER — ROCURONIUM BROMIDE 50 MG/5ML IV SOLN
INTRAVENOUS | Status: AC
  Filled 2018-02-11: qty 1

## 2018-02-11 MED ORDER — LIDOCAINE HCL (CARDIAC) PF 100 MG/5ML IV SOSY
PREFILLED_SYRINGE | INTRAVENOUS | Status: DC | PRN
  Administered 2018-02-11: 100 mg via INTRAVENOUS

## 2018-02-11 MED ORDER — PROPOFOL 10 MG/ML IV BOLUS
INTRAVENOUS | Status: DC | PRN
  Administered 2018-02-11: 200 mg via INTRAVENOUS

## 2018-02-11 MED ORDER — SODIUM CHLORIDE 0.9 % IV SOLN: 250.0000 mL | INTRAVENOUS | Status: DC

## 2018-02-11 MED ORDER — PROPOFOL 10 MG/ML IV BOLUS
INTRAVENOUS | Status: AC
  Filled 2018-02-11: qty 20

## 2018-02-11 MED ORDER — DEXAMETHASONE SODIUM PHOSPHATE 10 MG/ML IJ SOLN
INTRAMUSCULAR | Status: AC
  Filled 2018-02-11: qty 1

## 2018-02-11 MED ORDER — 0.9 % SODIUM CHLORIDE (POUR BTL) OPTIME
TOPICAL | Status: DC | PRN
  Administered 2018-02-11: 1000 mL

## 2018-02-11 MED ORDER — HYDROCODONE-ACETAMINOPHEN 5-325 MG PO TABS
ORAL_TABLET | ORAL | Status: AC
  Filled 2018-02-11: qty 2

## 2018-02-11 MED ORDER — KETOROLAC TROMETHAMINE 30 MG/ML IJ SOLN
INTRAMUSCULAR | Status: AC
  Administered 2018-02-11: 30 mg
  Filled 2018-02-11: qty 1

## 2018-02-11 MED ORDER — FENTANYL CITRATE (PF) 100 MCG/2ML IJ SOLN
INTRAMUSCULAR | Status: AC
  Filled 2018-02-11: qty 2

## 2018-02-11 MED ORDER — ACETAMINOPHEN 10 MG/ML IV SOLN
INTRAVENOUS | Status: AC
  Filled 2018-02-11: qty 100

## 2018-02-11 MED ORDER — MENTHOL 3 MG MT LOZG: 1.0000 | LOZENGE | OROMUCOSAL | Status: DC | PRN

## 2018-02-11 MED ORDER — ONDANSETRON HCL 4 MG/2ML IJ SOLN
INTRAMUSCULAR | Status: AC
  Filled 2018-02-11: qty 2

## 2018-02-11 MED ORDER — BISACODYL 10 MG RE SUPP: 10.0000 mg | Freq: Every day | RECTAL | Status: DC | PRN

## 2018-02-11 MED ORDER — ACETAMINOPHEN 325 MG PO TABS: 650.0000 mg | ORAL_TABLET | ORAL | Status: DC | PRN

## 2018-02-11 MED ORDER — EPHEDRINE SULFATE 50 MG/ML IJ SOLN
INTRAMUSCULAR | Status: AC
  Filled 2018-02-11: qty 1

## 2018-02-11 MED ORDER — HYDROCODONE-ACETAMINOPHEN 5-325 MG PO TABS
1.0000 | ORAL_TABLET | ORAL | Status: DC | PRN
  Administered 2018-02-11: 2 via ORAL
  Administered 2018-02-11: 1 via ORAL
  Administered 2018-02-11 – 2018-02-12 (×4): 2 via ORAL
  Filled 2018-02-11 (×3): qty 2
  Filled 2018-02-11: qty 1
  Filled 2018-02-11: qty 2

## 2018-02-11 MED ORDER — CYCLOBENZAPRINE HCL 5 MG PO TABS
5.0000 mg | ORAL_TABLET | Freq: Three times a day (TID) | ORAL | Status: DC | PRN
  Administered 2018-02-11: 10 mg via ORAL
  Administered 2018-02-11: 5 mg via ORAL
  Filled 2018-02-11: qty 1

## 2018-02-11 MED ORDER — LAMOTRIGINE ER 250 MG PO TB24
250.0000 mg | ORAL_TABLET | Freq: Every day | ORAL | Status: DC
Start: 2018-02-11 — End: 2018-02-11

## 2018-02-11 MED ORDER — HEMOSTATIC AGENTS (NO CHARGE) OPTIME
TOPICAL | Status: DC | PRN
  Administered 2018-02-11 (×2): 1 via TOPICAL

## 2018-02-11 MED ORDER — HYDROXYZINE HCL 50 MG PO TABS
50.0000 mg | ORAL_TABLET | ORAL | Status: DC | PRN
  Filled 2018-02-11: qty 1

## 2018-02-11 MED ORDER — MORPHINE SULFATE (PF) 4 MG/ML IV SOLN: 4.0000 mg | INTRAVENOUS | Status: DC | PRN

## 2018-02-11 MED ORDER — LIDOCAINE-EPINEPHRINE 1 %-1:100000 IJ SOLN
INTRAMUSCULAR | Status: DC | PRN
  Administered 2018-02-11: 5 mL via INTRADERMAL

## 2018-02-11 MED ORDER — BUPIVACAINE HCL (PF) 0.5 % IJ SOLN
INTRAMUSCULAR | Status: AC
  Filled 2018-02-11: qty 30

## 2018-02-11 MED ORDER — ALUM & MAG HYDROXIDE-SIMETH 200-200-20 MG/5ML PO SUSP: 30.0000 mL | Freq: Four times a day (QID) | ORAL | Status: DC | PRN

## 2018-02-11 MED ORDER — MIDAZOLAM HCL 2 MG/2ML IJ SOLN
INTRAMUSCULAR | Status: AC
  Filled 2018-02-11: qty 2

## 2018-02-11 MED ORDER — DEXAMETHASONE SODIUM PHOSPHATE 10 MG/ML IJ SOLN
INTRAMUSCULAR | Status: DC | PRN
  Administered 2018-02-11: 10 mg via INTRAVENOUS

## 2018-02-11 MED ORDER — LACTATED RINGERS IV SOLN
INTRAVENOUS | Status: DC
  Administered 2018-02-11 (×2): via INTRAVENOUS

## 2018-02-11 MED ORDER — SODIUM CHLORIDE 0.9% FLUSH: 3.0000 mL | INTRAVENOUS | Status: DC | PRN

## 2018-02-11 MED ORDER — ACETAMINOPHEN 650 MG RE SUPP: 650.0000 mg | RECTAL | Status: DC | PRN

## 2018-02-11 MED ORDER — PHENOL 1.4 % MT LIQD: 1.0000 | OROMUCOSAL | Status: DC | PRN

## 2018-02-11 MED ORDER — OXYCODONE HCL 5 MG PO TABS
5.0000 mg | ORAL_TABLET | Freq: Once | ORAL | Status: AC | PRN
  Administered 2018-02-11: 5 mg via ORAL

## 2018-02-11 MED ORDER — FENTANYL CITRATE (PF) 250 MCG/5ML IJ SOLN
INTRAMUSCULAR | Status: AC
Start: 2018-02-11 — End: ?
  Filled 2018-02-11: qty 5

## 2018-02-11 MED ORDER — MIDAZOLAM HCL 5 MG/5ML IJ SOLN
INTRAMUSCULAR | Status: DC | PRN
  Administered 2018-02-11: 2 mg via INTRAVENOUS

## 2018-02-11 MED ORDER — ONDANSETRON HCL 4 MG/2ML IJ SOLN
INTRAMUSCULAR | Status: DC | PRN
  Administered 2018-02-11: 4 mg via INTRAVENOUS

## 2018-02-11 MED ORDER — KETOROLAC TROMETHAMINE 30 MG/ML IJ SOLN
30.0000 mg | Freq: Once | INTRAMUSCULAR | Status: AC
  Administered 2018-02-11: 30 mg via INTRAVENOUS

## 2018-02-11 MED ORDER — PAROXETINE HCL 20 MG PO TABS
40.0000 mg | ORAL_TABLET | ORAL | Status: DC
  Administered 2018-02-12: 40 mg via ORAL
  Filled 2018-02-11: qty 2

## 2018-02-11 MED ORDER — PHENYLEPHRINE HCL 10 MG/ML IJ SOLN
INTRAMUSCULAR | Status: DC | PRN
  Administered 2018-02-11 (×3): 80 ug via INTRAVENOUS

## 2018-02-11 MED ORDER — FLEET ENEMA 7-19 GM/118ML RE ENEM: 1.0000 | ENEMA | Freq: Once | RECTAL | Status: DC | PRN

## 2018-02-11 MED ORDER — THROMBIN 5000 UNITS EX SOLR
CUTANEOUS | Status: DC | PRN
Start: 2018-02-11 — End: 2018-02-11
  Administered 2018-02-11 (×3): 5000 [IU] via TOPICAL

## 2018-02-11 MED ORDER — KETOROLAC TROMETHAMINE 30 MG/ML IJ SOLN
30.0000 mg | Freq: Four times a day (QID) | INTRAMUSCULAR | Status: DC
  Administered 2018-02-11 – 2018-02-12 (×3): 30 mg via INTRAVENOUS
  Filled 2018-02-11 (×3): qty 1

## 2018-02-11 MED ORDER — FENTANYL CITRATE (PF) 100 MCG/2ML IJ SOLN
INTRAMUSCULAR | Status: DC | PRN
  Administered 2018-02-11 (×2): 50 ug via INTRAVENOUS
  Administered 2018-02-11: 100 ug via INTRAVENOUS
  Administered 2018-02-11: 50 ug via INTRAVENOUS

## 2018-02-11 MED ORDER — OXYCODONE HCL 5 MG PO TABS
ORAL_TABLET | ORAL | Status: AC
  Filled 2018-02-11: qty 1

## 2018-02-11 MED ORDER — EPHEDRINE SULFATE 50 MG/ML IJ SOLN
INTRAMUSCULAR | Status: DC | PRN
  Administered 2018-02-11 (×3): 10 mg via INTRAVENOUS

## 2018-02-11 MED ORDER — PROMETHAZINE HCL 25 MG/ML IJ SOLN: 6.2500 mg | INTRAMUSCULAR | Status: DC | PRN

## 2018-02-11 MED ORDER — CEFAZOLIN SODIUM-DEXTROSE 2-4 GM/100ML-% IV SOLN
INTRAVENOUS | Status: AC
  Filled 2018-02-11: qty 100

## 2018-02-11 MED ORDER — ACETAMINOPHEN 10 MG/ML IV SOLN
INTRAVENOUS | Status: DC | PRN
  Administered 2018-02-11: 1000 mg via INTRAVENOUS

## 2018-02-11 MED ORDER — KCL IN DEXTROSE-NACL 20-5-0.45 MEQ/L-%-% IV SOLN: INTRAVENOUS | Status: DC

## 2018-02-11 MED ORDER — HYDROXYZINE HCL 50 MG/ML IM SOLN: 50.0000 mg | INTRAMUSCULAR | Status: DC | PRN

## 2018-02-11 MED ORDER — PHENYLEPHRINE 40 MCG/ML (10ML) SYRINGE FOR IV PUSH (FOR BLOOD PRESSURE SUPPORT)
PREFILLED_SYRINGE | INTRAVENOUS | Status: AC
  Filled 2018-02-11: qty 10

## 2018-02-11 MED ORDER — CYCLOBENZAPRINE HCL 10 MG PO TABS
ORAL_TABLET | ORAL | Status: AC
  Filled 2018-02-11: qty 1

## 2018-02-11 MED ORDER — CEFAZOLIN SODIUM-DEXTROSE 2-4 GM/100ML-% IV SOLN
2.0000 g | INTRAVENOUS | Status: AC
  Administered 2018-02-11: 2 g via INTRAVENOUS

## 2018-02-11 SURGICAL SUPPLY — 60 items
ADH SKN CLS APL DERMABOND .7 (GAUZE/BANDAGES/DRESSINGS) ×1
ALLOGRAFT CA 6X14X11 (Bone Implant) ×2 IMPLANT
BAG DECANTER FOR FLEXI CONT (MISCELLANEOUS) ×2 IMPLANT
BIT DRILL 14X2.5XNS TI ANT (BIT) ×1 IMPLANT
BIT DRILL AVIATOR 14 (BIT) ×2
BIT DRILL NEURO 2X3.1 SFT TUCH (MISCELLANEOUS) ×1 IMPLANT
BIT DRL 14X2.5XNS TI ANT (BIT) ×1
BLADE ULTRA TIP 2M (BLADE) IMPLANT
CANISTER SUCT 3000ML PPV (MISCELLANEOUS) ×2 IMPLANT
CARTRIDGE OIL MAESTRO DRILL (MISCELLANEOUS) ×1 IMPLANT
COVER MAYO STAND STRL (DRAPES) ×2 IMPLANT
DECANTER SPIKE VIAL GLASS SM (MISCELLANEOUS) ×2 IMPLANT
DERMABOND ADVANCED (GAUZE/BANDAGES/DRESSINGS) ×1
DERMABOND ADVANCED .7 DNX12 (GAUZE/BANDAGES/DRESSINGS) ×1 IMPLANT
DIFFUSER DRILL AIR PNEUMATIC (MISCELLANEOUS) ×2 IMPLANT
DRAPE HALF SHEET 40X57 (DRAPES) ×1 IMPLANT
DRAPE LAPAROTOMY 100X72 PEDS (DRAPES) ×2 IMPLANT
DRAPE MICROSCOPE LEICA (MISCELLANEOUS) ×2 IMPLANT
DRAPE POUCH INSTRU U-SHP 10X18 (DRAPES) ×2 IMPLANT
DRILL NEURO 2X3.1 SOFT TOUCH (MISCELLANEOUS) ×4
ELECT COATED BLADE 2.86 ST (ELECTRODE) ×2 IMPLANT
ELECT REM PT RETURN 9FT ADLT (ELECTROSURGICAL) ×2
ELECTRODE REM PT RTRN 9FT ADLT (ELECTROSURGICAL) ×1 IMPLANT
GAUZE SPONGE 4X4 12PLY STRL LF (GAUZE/BANDAGES/DRESSINGS) ×1 IMPLANT
GLOVE BIOGEL PI IND STRL 8 (GLOVE) ×1 IMPLANT
GLOVE BIOGEL PI INDICATOR 8 (GLOVE) ×1
GLOVE ECLIPSE 7.5 STRL STRAW (GLOVE) ×2 IMPLANT
GLOVE EXAM NITRILE LRG STRL (GLOVE) IMPLANT
GLOVE EXAM NITRILE XL STR (GLOVE) IMPLANT
GLOVE EXAM NITRILE XS STR PU (GLOVE) IMPLANT
GOWN STRL REUS W/ TWL LRG LVL3 (GOWN DISPOSABLE) IMPLANT
GOWN STRL REUS W/ TWL XL LVL3 (GOWN DISPOSABLE) ×1 IMPLANT
GOWN STRL REUS W/TWL 2XL LVL3 (GOWN DISPOSABLE) IMPLANT
GOWN STRL REUS W/TWL LRG LVL3 (GOWN DISPOSABLE) ×4
GOWN STRL REUS W/TWL XL LVL3 (GOWN DISPOSABLE) ×4
HALTER HD/CHIN CERV TRACTION D (MISCELLANEOUS) ×2 IMPLANT
HEMOSTAT POWDER KIT SURGIFOAM (HEMOSTASIS) IMPLANT
KIT BASIN OR (CUSTOM PROCEDURE TRAY) ×2 IMPLANT
KIT TURNOVER KIT B (KITS) ×2 IMPLANT
NDL HYPO 25X1 1.5 SAFETY (NEEDLE) ×1 IMPLANT
NDL SPNL 22GX3.5 QUINCKE BK (NEEDLE) ×1 IMPLANT
NEEDLE HYPO 25X1 1.5 SAFETY (NEEDLE) ×2 IMPLANT
NEEDLE SPNL 22GX3.5 QUINCKE BK (NEEDLE) ×4 IMPLANT
NS IRRIG 1000ML POUR BTL (IV SOLUTION) ×2 IMPLANT
OIL CARTRIDGE MAESTRO DRILL (MISCELLANEOUS) ×2
PACK LAMINECTOMY NEURO (CUSTOM PROCEDURE TRAY) ×2 IMPLANT
PAD ARMBOARD 7.5X6 YLW CONV (MISCELLANEOUS) ×6 IMPLANT
PLATE AVIATOR ASSY 2LVL SZ 30 (Plate) ×1 IMPLANT
RUBBERBAND STERILE (MISCELLANEOUS) ×4 IMPLANT
SCREW AVIATOR VAR SELFTAP 4X16 (Screw) ×6 IMPLANT
SPONGE INTESTINAL PEANUT (DISPOSABLE) ×3 IMPLANT
SPONGE SURGIFOAM ABS GEL 100 (HEMOSTASIS) ×2 IMPLANT
STAPLER SKIN PROX WIDE 3.9 (STAPLE) IMPLANT
SUT VIC AB 2-0 CP2 18 (SUTURE) ×2 IMPLANT
SUT VIC AB 3-0 SH 8-18 (SUTURE) ×2 IMPLANT
SYR CONTROL 10ML LL (SYRINGE) ×1 IMPLANT
TAPE CLOTH 4X10 WHT NS (GAUZE/BANDAGES/DRESSINGS) ×1 IMPLANT
TOWEL GREEN STERILE (TOWEL DISPOSABLE) ×2 IMPLANT
TOWEL GREEN STERILE FF (TOWEL DISPOSABLE) ×2 IMPLANT
WATER STERILE IRR 1000ML POUR (IV SOLUTION) ×2 IMPLANT

## 2018-02-11 NOTE — Transfer of Care (Signed)
Immediate Anesthesia Transfer of Care Note  Patient: Adrian Mclaughlin  Procedure(s) Performed: ANTERIOR CERVICAL DECOMPRESSION/DISCECTOMY FUSION CERVICAL THREE-FOUR, FOUR-FIVE (N/A )  Patient Location: PACU  Anesthesia Type:General  Level of Consciousness: awake, alert , oriented and patient cooperative  Airway & Oxygen Therapy: Patient Spontanous Breathing and Patient connected to nasal cannula oxygen  Post-op Assessment: Report given to RN and Post -op Vital signs reviewed and stable  Post vital signs: Reviewed  Last Vitals:  Vitals Value Taken Time  BP 158/84 02/11/2018  1:32 PM  Temp    Pulse 97 02/11/2018  1:37 PM  Resp 13 02/11/2018  1:37 PM  SpO2 93 % 02/11/2018  1:37 PM  Vitals shown include unvalidated device data.  Last Pain:  Vitals:   02/11/18 0906  TempSrc:   PainSc: 0-No pain      Patients Stated Pain Goal: 3 (14/43/15 4008)  Complications: No apparent anesthesia complications

## 2018-02-11 NOTE — Progress Notes (Signed)
Vitals:   02/11/18 0906 02/11/18 1332 02/11/18 1347 02/11/18 1500  BP:   (!) 152/91 (!) 154/89  Pulse:   93   Resp:   (!) 9   Temp:  (!) 97.5 F (36.4 C)  (!) 97.3 F (36.3 C)  TempSrc:      SpO2:   91%   Weight: 84.4 kg (186 lb)     Height: 6' (1.829 m)        Patient sitting up in chair in PACU, comfortable.  Has ambulated around the PACU patient.  Moving all 4 extremities well.  Dressing clean and dry, no swelling of neck.  Has voided.  Plan: Been well following surgery.  Waiting for bed on neurosurgery floor.  Encouraged to continue to ambulate.  Continue to progress through postoperative recovery.  Hosie Spangle, MD 02/11/2018, 5:34 PM

## 2018-02-11 NOTE — H&P (Signed)
Subjective: Patient is a 62 y.o. right handed white male who is admitted for treatment of multilevel cervical spondylosis, degenerative disease and cervical disc herniation with spinal cord compression and increased signal within the spinal cord with cervical myeloradiculopathy.  Patient has had varying amount of symptoms for the past 3 years, but is more recently developed left cervical radiculopathy.  However updated MRI scan showed significant progression of degeneration at the C3-4 and C4-5 levels with relative stability of degenerative changes at the C5-6 and C6-7 levels.  He is admitted now for a 2 level C3-4 and C4-5 anterior cervical decompression arthrodesis with structural allograft and cervical plating.   Patient Active Problem List   Diagnosis Date Noted  . DEPRESSION 12/17/2007  . HYPERTENSION 12/17/2007  . COLONIC POLYPS, HX OF 12/17/2007  . NEPHROLITHIASIS, HX OF 12/17/2007  . SPINAL STENOSIS, CERVICAL 12/15/2007  . BACK PAIN 12/15/2007   Past Medical History:  Diagnosis Date  . Anxiety   . Depression   . History of kidney stones   . Hypertension   . Mucoid cyst of joint    left long finger    Past Surgical History:  Procedure Laterality Date  . FOREARM FRACTURE SURGERY Left   . MASS EXCISION Left 04/15/2016   Procedure: LEFT LONG FINGER EXCISION MASS;  Surgeon: Leanora Cover, MD;  Location: [facility];  Service: Orthopedics;  Laterality: Left;  . SUBACROMIAL DECOMPRESSION Left   . TONSILLECTOMY      No medications prior to admission.   No Known Allergies  Social History   Tobacco Use  . Smoking status: Never Smoker  . Smokeless tobacco: Never Used  Substance Use Topics  . Alcohol use: Yes    Comment: social    No family history on file.   Review of Systems Pertinent items noted in HPI and remainder of comprehensive ROS otherwise negative.  Objective: Vital signs in last 24 hours:    EXAM: Patient is well-developed well-nourished white male  in no acute distress.  Lungs are clear to auscultation , the patient has symmetrical respiratory excursion. Heart has a regular rate and rhythm normal S1 and S2 no murmur.   Abdomen is soft nontender nondistended bowel sounds are present. Extremity examination shows no clubbing cyanosis or edema. Motor examination shows 5 over 5 strength in the upper extremities including the deltoid biceps triceps and intrinsics and grip, as well as 5/5 strength in the iliopsoas bilaterally. Sensation is intact to pinprick throughout the digits of the upper extremities. Reflex examination shows no evidence of pathologic reflexes. Patient has a normal gait and stance.  Data Review:CBC    Component Value Date/Time   WBC 5.3 02/03/2018 1018   RBC 5.25 02/03/2018 1018   HGB 14.8 02/03/2018 1018   HCT 44.7 02/03/2018 1018   PLT 238 02/03/2018 1018   MCV 85.1 02/03/2018 1018   MCH 28.2 02/03/2018 1018   MCHC 33.1 02/03/2018 1018   RDW 12.7 02/03/2018 1018   LYMPHSABS 1.0 06/03/2007 1550   MONOABS 0.4 06/03/2007 1550   EOSABS 0.0 06/03/2007 1550   BASOSABS 0.0 06/03/2007 1550                          BMET    Component Value Date/Time   NA 137 02/03/2018 1018   K 4.4 02/03/2018 1018   CL 100 (L) 02/03/2018 1018   CO2 30 02/03/2018 1018   GLUCOSE 89 02/03/2018 1018   BUN 12  02/03/2018 1018   CREATININE 0.97 02/03/2018 1018   CALCIUM 10.7 (H) 02/03/2018 1018   GFRNONAA >60 02/03/2018 1018   GFRAA >60 02/03/2018 1018     Assessment/Plan: Patient with advanced 4 level cervical degeneration, but with significant progression at the C3-4 and C4-5 levels with spinal cord compression and altered cord signal consistent with myelopathy.  He is having left cervical radicular symptoms.  He is admitted now for a two-level C3-4 and C4-5 ACDF.  I've discussed with the patient the nature of his condition, the nature the surgical procedure, the typical length of surgery, hospital stay, and overall recuperation. We  discussed limitations postoperatively. I discussed risks of surgery including risks of infection, bleeding, possibly need for transfusion, the risk of nerve root dysfunction with pain, weakness, numbness, or paresthesias, the risk of spinal cord dysfunction with paralysis of all 4 limbs and quadriplegia, and the risk of dural tear and CSF leakage and possible need for further surgery, the risk of esophageal dysfunction causing dysphagia and the risk of laryngeal dysfunction causing hoarseness of the voice, the risk of failure of the arthrodesis and the possible need for further surgery, and the risk of anesthetic complications including myocardial infarction, stroke, pneumonia, and death. We also discussed the need for postoperative immobilization in a cervical collar. Understanding all this the patient does wish to proceed with surgery and is admitted for such.   Hosie Spangle, MD 02/11/2018 7:30 AM

## 2018-02-11 NOTE — Anesthesia Preprocedure Evaluation (Addendum)
Anesthesia Evaluation  Patient identified by MRN, date of birth, ID band Patient awake    Reviewed: Allergy & Precautions, NPO status , Patient's Chart, lab work & pertinent test results  Airway Mallampati: II  TM Distance: >3 FB     Dental  (+) Teeth Intact, Dental Advisory Given   Pulmonary neg pulmonary ROS,    breath sounds clear to auscultation       Cardiovascular Exercise Tolerance: Good hypertension, Pt. on medications  Rhythm:Regular Rate:Normal     Neuro/Psych PSYCHIATRIC DISORDERS Anxiety Depression    GI/Hepatic negative GI ROS, Neg liver ROS,   Endo/Other  negative endocrine ROS  Renal/GU  Hx nephrolithiasis   negative genitourinary   Musculoskeletal negative musculoskeletal ROS (+)   Abdominal   Peds  Hematology negative hematology ROS (+)   Anesthesia Other Findings   Reproductive/Obstetrics                            Anesthesia Physical  Anesthesia Plan  ASA: II  Anesthesia Plan: General   Post-op Pain Management:    Induction: Intravenous  PONV Risk Score and Plan: 3 and Treatment may vary due to age or medical condition, Ondansetron, Dexamethasone and Midazolam  Airway Management Planned: Oral ETT and Video Laryngoscope Planned  Additional Equipment: None  Intra-op Plan:   Post-operative Plan: Extubation in OR  Informed Consent: I have reviewed the patients History and Physical, chart, labs and discussed the procedure including the risks, benefits and alternatives for the proposed anesthesia with the patient or authorized representative who has indicated his/her understanding and acceptance.   Dental advisory given  Plan Discussed with: CRNA and Anesthesiologist  Anesthesia Plan Comments:         Anesthesia Quick Evaluation

## 2018-02-11 NOTE — Op Note (Signed)
02/11/2018  1:20 PM  PATIENT:  Adrian Mclaughlin  62 y.o. male  PRE-OPERATIVE DIAGNOSIS: C3-4 and C4-5 cervical disc herniation with cervical myeloradiculopathy, cervical spondylosis with myelopathy, cervical degenerative disc disease  POST-OPERATIVE DIAGNOSIS:  C3-4 and C4-5 cervical disc herniation with cervical myeloradiculopathy, cervical spondylosis with myelopathy, cervical degenerative disc disease  PROCEDURE:  Procedure(s): C3-4 and C4-5 anterior cervical decompression and arthrodesis with structural allograft and aviator cervical plating  SURGEON: Jovita Gamma, MD  ASSISTANTS: Sherley Bounds, MD  ANESTHESIA:   general  EBL:  Total I/O In: 1000 [I.V.:1000] Out: 100 [Blood:100]  BLOOD ADMINISTERED:none  COUNT:  Correct per nursing staff  DICTATION: Patient was brought to the operating room placed under general endotracheal anesthesia. Patient was placed in 10 pounds of halter traction. The neck was prepped with Betadine soap and solution and draped in a sterile fashion. A horizontal incision was made on the left side of the neck. The line of the incision was infiltrated with local anesthetic with epinephrine. Dissection was carried down thru the subcutaneous tissue and platysma, bipolar cautery was used to maintain hemostasis. Dissection was then carried out thru an avascular plane leaving the sternocleidomastoid carotid artery and jugular vein laterally and the trachea and esophagus medially. The ventral aspect of the vertebral column was identified and a localizing x-ray was taken. The C3-4 and C4-5 levels were identified. The annulus at each level was incised and the disc space entered. Discectomy was performed with micro-curettes and pituitary rongeurs. The operating microscope was draped and brought into the field provided additional magnification illumination and visualization. Discectomy was continued posteriorly thru the disc space and then the cartilaginous endplate was removed  using micro-curettes along with the high-speed drill. Posterior osteophytic overgrowth was removed each level using the high-speed drill along with a 2 mm thin footplated Kerrison punch. Posterior longitudinal ligament along with disc herniation was carefully removed, decompressing the spinal canal and thecal sac. We then continued to remove osteophytic overgrowth and disc material decompressing the neural foramina and exiting nerve roots bilaterally. Once the decompression was completed hemostasis was established at each level with the use of Gelfoam with thrombin and bipolar cautery. The Gelfoam was removed, a thin layer of Surgifoam was applied, the wound irrigated and hemostasis confirmed. We then measured the height of the intravertebral disc space level and selected a 6 millimeter in height structural allograft for the C3-4 level and a 6 millimeter in height structural allograft for the C4-5 level . Each was hydrated and saline solution and then gently positioned in the intravertebral disc space and countersunk. We then selected a 30 millimeter in height Aviator cervical plate. It was positioned over the fusion construct and secured to the vertebra with a pair of 4 x 14 mm self-tapping variable screws at each level. Each screw hole was started with the high-speed drill and then the screws placed, once all the screws were placed, the locking system was secured. The wound was irrigated with bacitracin solution checked for hemostasis which was established and confirmed. An x-ray was taken which showed grafts in good position, the plate and screws in good position, and the overall construct looked good. We then proceeded with closure. The platysma was closed with interrupted inverted 2-0 undyed Vicryl suture, the subcutaneous and subcuticular closed with interrupted inverted 3-0 undyed Vicryl suture. The skin edges were approximated with Dermabond.  A dressing of sterile gauze and Hypafix was applied.  Following  surgery the patient was taken out of cervical traction. To  be reversed and the anesthetic and taken to the recovery room for further care.  PLAN OF CARE: Admit for overnight observation  PATIENT DISPOSITION:  PACU - hemodynamically stable.   Delay start of Pharmacological VTE agent (>24hrs) due to surgical blood loss or risk of bleeding:  yes

## 2018-02-11 NOTE — Progress Notes (Signed)
Off monitor pending room assignment/ ambulated in hall x 500 feet w/out problems and to BR to void/successful/ now having his meal

## 2018-02-11 NOTE — Anesthesia Postprocedure Evaluation (Signed)
Anesthesia Post Note  Patient: Adrian Mclaughlin  Procedure(s) Performed: ANTERIOR CERVICAL DECOMPRESSION/DISCECTOMY FUSION CERVICAL THREE-FOUR, FOUR-FIVE (N/A )     Patient location during evaluation: PACU Anesthesia Type: General Level of consciousness: awake and alert Pain management: pain level controlled Vital Signs Assessment: post-procedure vital signs reviewed and stable Respiratory status: spontaneous breathing, nonlabored ventilation and respiratory function stable Cardiovascular status: blood pressure returned to baseline and stable Postop Assessment: no apparent nausea or vomiting Anesthetic complications: no    Last Vitals:  Vitals:   02/11/18 1332 02/11/18 1347  BP:  (!) 152/91  Pulse:  93  Resp:  (!) 9  Temp: (!) 36.4 C   SpO2:  91%    Last Pain:  Vitals:   02/11/18 1347  TempSrc:   PainSc: [place]

## 2018-02-11 NOTE — Anesthesia Procedure Notes (Signed)
Procedure Name: Intubation Date/Time: 02/11/2018 10:19 AM Performed by: Shirlyn Goltz, CRNA Pre-anesthesia Checklist: Patient identified, Emergency Drugs available, Suction available and Patient being monitored Patient Re-evaluated:Patient Re-evaluated prior to induction Oxygen Delivery Method: Circle system utilized Preoxygenation: Pre-oxygenation with 100% oxygen Induction Type: IV induction Ventilation: Oral airway inserted - appropriate to patient size and Mask ventilation without difficulty Laryngoscope Size: Glidescope and 4 (elective for cervical fusion) Grade View: Grade I Tube type: Oral Tube size: 7.5 mm Number of attempts: 1 Airway Equipment and Method: Video-laryngoscopy and Rigid stylet Placement Confirmation: ETT inserted through vocal cords under direct vision,  positive ETCO2 and breath sounds checked- equal and bilateral Secured at: 20 cm Tube secured with: Tape Dental Injury: Teeth and Oropharynx as per pre-operative assessment

## 2018-02-12 ENCOUNTER — Encounter (HOSPITAL_COMMUNITY): Payer: Self-pay | Admitting: Neurosurgery

## 2018-02-12 DIAGNOSIS — M50021 Cervical disc disorder at C4-C5 level with myelopathy: Secondary | ICD-10-CM | POA: Diagnosis not present

## 2018-02-12 MED ORDER — HYDROCODONE-ACETAMINOPHEN 5-325 MG PO TABS: 1.0000 | ORAL_TABLET | ORAL | 0 refills | Status: DC | PRN

## 2018-02-12 NOTE — Discharge Summary (Signed)
Physician Discharge Summary  Patient ID: Adrian Mclaughlin MRN: [id number] DOB/AGE: [birth year] 63 y.o.  Admit date: 02/11/2018 Discharge date: 02/12/2018  Admission Diagnoses:  C3-4 and C4-5 cervical disc herniation with cervical myeloradiculopathy, cervical spondylosis with myelopathy, cervical degenerative disc disease  Discharge Diagnoses:  C3-4 and C4-5 cervical disc herniation with cervical myeloradiculopathy, cervical spondylosis with myelopathy, cervical degenerative disc disease  Active Problems:   HNP (herniated nucleus pulposus) with myelopathy, cervical   Discharged Condition: good  Hospital Course: Patient was admitted, underwent a C3-4 and C4-5 anterior cervical decompression and arthrodesis with structural allograft and cervical plating.  Postoperatively he is done well.  He is comfortable.  He is up and ambulating actively in the halls, he is voiding well.  He is asking to be discharged home.  We have given him instructions regarding wound care and activities following discharge.  He is scheduled for follow-up with me in the office in 3 to 4 weeks with an x-ray.  Discharge Exam: Blood pressure 134/85, pulse 64, temperature 98 F (36.7 C), temperature source Oral, resp. rate 18, height 6' (1.829 m), weight 84.4 kg (186 lb), SpO2 97 %.  Disposition: Discharge disposition: 01-Home or Self Care       Discharge Instructions    Discharge wound care:   Complete by:  As directed    Leave the wound open to air. Shower daily with the wound uncovered. Water and soapy water should run over the incision area. Do not wash directly on the incision for 2 weeks. Remove the glue after 2 weeks.   Driving Restrictions   Complete by:  As directed    No driving for 2 weeks. May ride in the car locally now. May begin to drive locally in 2 weeks.   Other Restrictions   Complete by:  As directed    Walk gradually increasing distances out in the fresh air at least twice a day. Walking  additional 6 times inside the house, gradually increasing distances, daily. No bending, lifting, or twisting. Perform activities between shoulder and waist height (that is at counter height when standing or table height when sitting).     Allergies as of 02/12/2018   No Known Allergies     Medication List    STOP taking these medications   oxyCODONE-acetaminophen 5-325 MG tablet Commonly known as:  PERCOCET     TAKE these medications   HYDROcodone-acetaminophen 5-325 MG tablet Commonly known as:  NORCO/VICODIN Take 1-2 tablets by mouth every 4 (four) hours as needed (pain).   LAMICTAL XR 250 MG Tb24 24 hour tablet Generic drug:  LamoTRIgine Take 250 mg by mouth daily.   Omega-3 1000 MG Caps Take 2,000 mg by mouth 2 (two) times daily.   PARoxetine 40 MG tablet Commonly known as:  PAXIL Take 40 mg by mouth every morning.   QUEtiapine 200 MG tablet Commonly known as:  SEROQUEL Take 200 mg by mouth at bedtime.   quinapril-hydrochlorothiazide 20-12.5 MG tablet Commonly known as:  ACCURETIC Take 1 tablet by mouth daily.            Discharge Care Instructions  (From admission, onward)        Start     Ordered   02/12/18 0000  Discharge wound care:    Comments:  Leave the wound open to air. Shower daily with the wound uncovered. Water and soapy water should run over the incision area. Do not wash directly on the incision for 2 weeks. Remove the glue  after 2 weeks.   02/12/18 1050       Signed: Hosie Spangle 02/12/2018, 10:50 AM

## 2018-02-12 NOTE — Progress Notes (Signed)
Patient alert and oriented, mae's well, voiding adequate amount of urine, swallowing without difficulty, no c/o pain at time of discharge. Patient discharged home with family. Script and discharged instructions given to patient. Patient and family stated understanding of instructions given. Patient has an appointment with Dr. Nudelman 

## 2018-02-26 ENCOUNTER — Encounter (HOSPITAL_COMMUNITY): Payer: Self-pay | Admitting: Neurosurgery

## 2018-03-03 DIAGNOSIS — Z23 Encounter for immunization: Secondary | ICD-10-CM | POA: Diagnosis not present

## 2018-03-11 DIAGNOSIS — M5000 Cervical disc disorder with myelopathy, unspecified cervical region: Secondary | ICD-10-CM | POA: Diagnosis not present

## 2018-03-16 DIAGNOSIS — R972 Elevated prostate specific antigen [PSA]: Secondary | ICD-10-CM | POA: Diagnosis not present

## 2018-04-20 DIAGNOSIS — R972 Elevated prostate specific antigen [PSA]: Secondary | ICD-10-CM | POA: Diagnosis not present

## 2018-04-20 DIAGNOSIS — I10 Essential (primary) hypertension: Secondary | ICD-10-CM | POA: Diagnosis not present

## 2018-04-20 DIAGNOSIS — E781 Pure hyperglyceridemia: Secondary | ICD-10-CM | POA: Diagnosis not present

## 2018-05-05 DIAGNOSIS — Z981 Arthrodesis status: Secondary | ICD-10-CM | POA: Diagnosis not present

## 2018-06-22 ENCOUNTER — Other Ambulatory Visit: Payer: Self-pay | Admitting: Psychiatry

## 2018-07-21 DIAGNOSIS — N401 Enlarged prostate with lower urinary tract symptoms: Secondary | ICD-10-CM | POA: Diagnosis not present

## 2018-07-21 DIAGNOSIS — R3915 Urgency of urination: Secondary | ICD-10-CM | POA: Diagnosis not present

## 2018-07-28 DIAGNOSIS — N401 Enlarged prostate with lower urinary tract symptoms: Secondary | ICD-10-CM | POA: Diagnosis not present

## 2018-07-28 DIAGNOSIS — R3915 Urgency of urination: Secondary | ICD-10-CM | POA: Diagnosis not present

## 2018-08-26 ENCOUNTER — Encounter: Payer: Self-pay | Admitting: Emergency Medicine

## 2018-08-26 DIAGNOSIS — F419 Anxiety disorder, unspecified: Secondary | ICD-10-CM | POA: Insufficient documentation

## 2018-08-26 DIAGNOSIS — F3342 Major depressive disorder, recurrent, in full remission: Secondary | ICD-10-CM | POA: Insufficient documentation

## 2018-08-27 DIAGNOSIS — Z23 Encounter for immunization: Secondary | ICD-10-CM | POA: Diagnosis not present

## 2018-09-01 ENCOUNTER — Other Ambulatory Visit: Payer: Self-pay | Admitting: Psychiatry

## 2018-09-02 NOTE — Telephone Encounter (Signed)
Need to review paper chart  

## 2018-09-03 DIAGNOSIS — Z981 Arthrodesis status: Secondary | ICD-10-CM | POA: Diagnosis not present

## 2018-09-06 ENCOUNTER — Ambulatory Visit: Payer: 59 | Admitting: Psychiatry

## 2018-09-06 ENCOUNTER — Encounter: Payer: Self-pay | Admitting: Psychiatry

## 2018-09-06 VITALS — BP 88/64 | HR 70

## 2018-09-06 DIAGNOSIS — F411 Generalized anxiety disorder: Secondary | ICD-10-CM | POA: Diagnosis not present

## 2018-09-06 DIAGNOSIS — F3342 Major depressive disorder, recurrent, in full remission: Secondary | ICD-10-CM | POA: Diagnosis not present

## 2018-09-06 DIAGNOSIS — G4700 Insomnia, unspecified: Secondary | ICD-10-CM

## 2018-09-06 MED ORDER — PAROXETINE HCL 40 MG PO TABS: 40.0000 mg | ORAL_TABLET | Freq: Every day | ORAL | 0 refills | Status: DC

## 2018-09-06 MED ORDER — QUETIAPINE FUMARATE ER 200 MG PO TB24: 200.0000 mg | ORAL_TABLET | Freq: Every day | ORAL | 1 refills | Status: DC

## 2018-09-06 MED ORDER — ALPRAZOLAM 0.5 MG PO TABS: 0.5000 mg | ORAL_TABLET | Freq: Every day | ORAL | 1 refills | Status: DC | PRN

## 2018-09-06 NOTE — Progress Notes (Signed)
Adrian Mclaughlin [id number] 23-Jul-1956 62 y.o.  Subjective:   Patient ID:  Adrian Mclaughlin is a 62 y.o. (DOB [birth year]) male.  Chief Complaint:  Chief Complaint  Patient presents with  . Follow-up    h/o depression and anxiety    HPI Adrian Mclaughlin presents to the office today for follow-up of depression and anxiety. He reports that his mood has been stable and denies significant depression or anxiety. Denies panic attacks. Reports adequate energy and motivation. He reports that he has been going on long-distance bike rides. Reports that his sleep and appetite have been good. Reports adequate concentration. Denies SI.   Reports that triglycerides were high in February and when re-checked several months later triglycerides were WNL.    Past Psychiatric Medication Trials: Paxil Zoloft Cymbalta Prozac Effexor Abilify Seroquel XR Lamictal Alprazolam   Review of Systems:  Review of Systems  Musculoskeletal: Negative for gait problem.       Reports that he continues to recover from neck surgery with some limited ROM.   Neurological: Negative for tremors.  Psychiatric/Behavioral:       Please refer to HPI    Medications: I have reviewed the patient's current medications.  Current Outpatient Medications  Medication Sig Dispense Refill  . ALPRAZolam (XANAX) 0.5 MG tablet Take 1 tablet (0.5 mg total) by mouth daily as needed for anxiety. 30 tablet 1  . lamoTRIgine (LAMICTAL) 150 MG tablet TAKE 1 TABLET BY MOUTH  EVERY DAY 90 tablet 0  . Omega-3 1000 MG CAPS Take 2,000 mg by mouth 2 (two) times daily.    Marland Kitchen PARoxetine (PAXIL) 40 MG tablet Take 1 tablet (40 mg total) by mouth daily. 90 tablet 0  . QUEtiapine (SEROQUEL XR) 200 MG 24 hr tablet Take 1 tablet (200 mg total) by mouth daily at 6 PM. 90 tablet 1  . quinapril-hydrochlorothiazide (ACCURETIC) 20-12.5 MG tablet Take 1 tablet by mouth daily.     No current facility-administered medications for this visit.      Medication Side Effects: None  Allergies: No Known Allergies  Past Medical History:  Diagnosis Date  . Anxiety   . Depression   . History of kidney stones   . Hypertension   . Mucoid cyst of joint    left long finger    Family History  Problem Relation Age of Onset  . Schizophrenia Maternal Aunt   . Autism Son   . Anxiety disorder Son   . Panic disorder Son     Social History   Socioeconomic History  . Marital status: Married    Spouse name: Not on file  . Number of children: Not on file  . Years of education: Not on file  . Highest education level: Not on file  Occupational History  . Not on file  Social Needs  . Financial resource strain: Not on file  . Food insecurity:    Worry: Not on file    Inability: Not on file  . Transportation needs:    Medical: Not on file    Non-medical: Not on file  Tobacco Use  . Smoking status: Never Smoker  . Smokeless tobacco: Never Used  Substance and Sexual Activity  . Alcohol use: Yes    Comment: 2-3 drinks a night  . Drug use: Never  . Sexual activity: Not on file  Lifestyle  . Physical activity:    Days per week: Not on file    Minutes per session: Not on file  .  Stress: Not on file  Relationships  . Social connections:    Talks on phone: Not on file    Gets together: Not on file    Attends religious service: Not on file    Active member of club or organization: Not on file    Attends meetings of clubs or organizations: Not on file    Relationship status: Not on file  . Intimate partner violence:    Fear of current or ex partner: Not on file    Emotionally abused: Not on file    Physically abused: Not on file    Forced sexual activity: Not on file  Other Topics Concern  . Not on file  Social History Narrative  . Not on file    Past Medical History, Surgical history, Social history, and Family history were reviewed and updated as appropriate.   Please see review of systems for further details on the  patient's review from today.   Objective:   Physical Exam:  BP (!) 88/64   Pulse 70   Physical Exam Constitutional:      General: He is not in acute distress.    Appearance: He is well-developed.  Musculoskeletal:        General: No deformity.  Neurological:     Mental Status: He is alert and oriented to person, place, and time.     Coordination: Coordination normal.  Psychiatric:        Mood and Affect: Mood is not anxious or depressed. Affect is not labile, blunt, angry or inappropriate.        Speech: Speech normal.        Behavior: Behavior normal.        Thought Content: Thought content normal. Thought content does not include homicidal or suicidal ideation. Thought content does not include homicidal or suicidal plan.        Judgment: Judgment normal.     Comments: Insight intact. No auditory or visual hallucinations. No delusions.      Lab Review:     Component Value Date/Time   NA 137 02/03/2018 1018   K 4.4 02/03/2018 1018   CL 100 (L) 02/03/2018 1018   CO2 30 02/03/2018 1018   GLUCOSE 89 02/03/2018 1018   BUN 12 02/03/2018 1018   CREATININE 0.97 02/03/2018 1018   CALCIUM 10.7 (H) 02/03/2018 1018   GFRNONAA >60 02/03/2018 1018   GFRAA >60 02/03/2018 1018       Component Value Date/Time   WBC 5.3 02/03/2018 1018   RBC 5.25 02/03/2018 1018   HGB 14.8 02/03/2018 1018   HCT 44.7 02/03/2018 1018   PLT 238 02/03/2018 1018   MCV 85.1 02/03/2018 1018   MCH 28.2 02/03/2018 1018   MCHC 33.1 02/03/2018 1018   RDW 12.7 02/03/2018 1018   LYMPHSABS 1.0 06/03/2007 1550   MONOABS 0.4 06/03/2007 1550   EOSABS 0.0 06/03/2007 1550   BASOSABS 0.0 06/03/2007 1550    No results found for: POCLITH, LITHIUM   No results found for: PHENYTOIN, PHENOBARB, VALPROATE, CBMZ   .res Assessment: Plan:    Major depressive disorder, recurrent episode, in full remission ([place]) - Chronic, stable - Plan: PARoxetine (PAXIL) 40 MG tablet, QUEtiapine (SEROQUEL XR) 200 MG 24 hr  tablet  Anxiety state - Stable. Chronic - Plan: ALPRAZolam (XANAX) 0.5 MG tablet, PARoxetine (PAXIL) 40 MG tablet  Insomnia, unspecified type - Episodic with travel  Please see After Visit Summary for patient specific instructions.  Future Appointments  Date Time  Provider [place]  03/08/2019  8:30 AM Thayer Headings, PMHNP [facility] None    No orders of the defined types were placed in this encounter.     -------------------------------

## 2018-10-12 DIAGNOSIS — M7710 Lateral epicondylitis, unspecified elbow: Secondary | ICD-10-CM | POA: Diagnosis not present

## 2018-10-12 DIAGNOSIS — I10 Essential (primary) hypertension: Secondary | ICD-10-CM | POA: Diagnosis not present

## 2018-12-07 ENCOUNTER — Other Ambulatory Visit: Payer: Self-pay | Admitting: Psychiatry

## 2018-12-07 DIAGNOSIS — F3342 Major depressive disorder, recurrent, in full remission: Secondary | ICD-10-CM

## 2018-12-07 DIAGNOSIS — F411 Generalized anxiety disorder: Secondary | ICD-10-CM

## 2019-02-14 ENCOUNTER — Other Ambulatory Visit: Payer: Self-pay | Admitting: Psychiatry

## 2019-02-14 DIAGNOSIS — F411 Generalized anxiety disorder: Secondary | ICD-10-CM

## 2019-03-08 ENCOUNTER — Ambulatory Visit: Payer: Self-pay | Admitting: Psychiatry

## 2019-03-30 ENCOUNTER — Other Ambulatory Visit: Payer: Self-pay | Admitting: Psychiatry

## 2019-03-30 DIAGNOSIS — F3342 Major depressive disorder, recurrent, in full remission: Secondary | ICD-10-CM

## 2019-04-01 ENCOUNTER — Ambulatory Visit (INDEPENDENT_AMBULATORY_CARE_PROVIDER_SITE_OTHER): Payer: 59 | Admitting: Psychiatry

## 2019-04-01 ENCOUNTER — Encounter: Payer: Self-pay | Admitting: Psychiatry

## 2019-04-01 ENCOUNTER — Other Ambulatory Visit: Payer: Self-pay

## 2019-04-01 DIAGNOSIS — F3342 Major depressive disorder, recurrent, in full remission: Secondary | ICD-10-CM | POA: Diagnosis not present

## 2019-04-01 NOTE — Progress Notes (Signed)
Adrian Mclaughlin [id number] [birth year] 63 y.o.  Virtual Visit via Video Note  I connected with pt on 04/01/19 at  1:00 PM EDT by a video enabled telemedicine application and verified that I am speaking with the correct person using two identifiers.   I discussed the limitations of evaluation and management by telemedicine and the availability of in person appointments. The patient expressed understanding and agreed to proceed.  I discussed the assessment and treatment plan with the patient. The patient was provided an opportunity to ask questions and all were answered. The patient agreed with the plan and demonstrated an understanding of the instructions.   The patient was advised to call back or seek an in-person evaluation if the symptoms worsen or if the condition fails to improve as anticipated.  I provided 25 minutes of non-face-to-face time during this encounter.  The patient was located at home.  The provider was located at [place].   Thayer Headings, PMHNP   Subjective:   Patient ID:  Adrian Mclaughlin is a 63 y.o. (DOB [birth year]) male.  Chief Complaint:  Chief Complaint  Patient presents with  . Follow-up    h/o Depression    HPI Adrian Mclaughlin presents for follow-up of depression and anxiety. He denies depressed mood. Denies anxiety. He reports that he is sleeping well. He reports that his appetite has been good. He reports that his energy and motivation have been good. He reports that he has been caving some, riding his bicycle, and motorcycle. He has continued to enjoy his hobbies. He reports adequate concentration. Denies SI.   He reports that he has been working from home and this has been working ok. He reports that this transition has been ok.   He reports that they had to replace the stove and then learned their cabinets will need to be   Review of Systems:  Review of Systems  Musculoskeletal: Positive for arthralgias. Negative for gait problem.      Reports that he has recovered well from surgery.   Neurological: Negative for tremors.  Psychiatric/Behavioral:       Please refer to HPI    Medications: I have reviewed the patient's current medications.  Current Outpatient Medications  Medication Sig Dispense Refill  . ALPRAZolam (XANAX) 0.5 MG tablet TAKE 1 TABLET BY MOUTH EVERY DAY AS NEEDED 30 tablet 0  . cholecalciferol (VITAMIN D3) 25 MCG (1000 UT) tablet Take 2,000 Units by mouth daily.    Marland Kitchen lamoTRIgine (LAMICTAL) 150 MG tablet TAKE 1 TABLET BY MOUTH  EVERY DAY 90 tablet 1  . Omega-3 1000 MG CAPS Take 2,000 mg by mouth 2 (two) times daily.    Marland Kitchen PARoxetine (PAXIL) 40 MG tablet TAKE 1 TABLET BY MOUTH  DAILY 90 tablet 1  . QUEtiapine (SEROQUEL XR) 200 MG 24 hr tablet TAKE 1 TABLET BY MOUTH  DAILY AT 6 PM 90 tablet 0  . quinapril-hydrochlorothiazide (ACCURETIC) 20-12.5 MG tablet Take 1 tablet by mouth daily.     No current facility-administered medications for this visit.     Medication Side Effects: None  Allergies: No Known Allergies  Past Medical History:  Diagnosis Date  . Anxiety   . Depression   . History of kidney stones   . Hypertension   . Mucoid cyst of joint    left long finger    Family History  Problem Relation Age of Onset  . Schizophrenia Maternal Aunt   . Autism Son   . Anxiety disorder  Son   . Panic disorder Son     Social History   Socioeconomic History  . Marital status: Married    Spouse name: Not on file  . Number of children: Not on file  . Years of education: Not on file  . Highest education level: Not on file  Occupational History  . Not on file  Social Needs  . Financial resource strain: Not on file  . Food insecurity    Worry: Not on file    Inability: Not on file  . Transportation needs    Medical: Not on file    Non-medical: Not on file  Tobacco Use  . Smoking status: Never Smoker  . Smokeless tobacco: Never Used  Substance and Sexual Activity  . Alcohol use: Yes     Comment: 2-3 drinks a night  . Drug use: Never  . Sexual activity: Not on file  Lifestyle  . Physical activity    Days per week: Not on file    Minutes per session: Not on file  . Stress: Not on file  Relationships  . Social Herbalist on phone: Not on file    Gets together: Not on file    Attends religious service: Not on file    Active member of club or organization: Not on file    Attends meetings of clubs or organizations: Not on file    Relationship status: Not on file  . Intimate partner violence    Fear of current or ex partner: Not on file    Emotionally abused: Not on file    Physically abused: Not on file    Forced sexual activity: Not on file  Other Topics Concern  . Not on file  Social History Narrative  . Not on file    Past Medical History, Surgical history, Social history, and Family history were reviewed and updated as appropriate.   Please see review of systems for further details on the patient's review from today.   Objective:   Physical Exam:  There were no vitals taken for this visit.  Physical Exam Neurological:     Mental Status: He is alert and oriented to person, place, and time.     Cranial Nerves: No dysarthria.  Psychiatric:        Attention and Perception: Attention normal.        Mood and Affect: Mood normal.        Speech: Speech normal.        Behavior: Behavior is cooperative.        Thought Content: Thought content normal. Thought content is not paranoid or delusional. Thought content does not include homicidal or suicidal ideation. Thought content does not include homicidal or suicidal plan.        Cognition and Memory: Cognition and memory normal.        Judgment: Judgment normal.     Lab Review:     Component Value Date/Time   NA 137 02/03/2018 1018   K 4.4 02/03/2018 1018   CL 100 (L) 02/03/2018 1018   CO2 30 02/03/2018 1018   GLUCOSE 89 02/03/2018 1018   BUN 12 02/03/2018 1018   CREATININE 0.97 02/03/2018  1018   CALCIUM 10.7 (H) 02/03/2018 1018   GFRNONAA >60 02/03/2018 1018   GFRAA >60 02/03/2018 1018       Component Value Date/Time   WBC 5.3 02/03/2018 1018   RBC 5.25 02/03/2018 1018   HGB 14.8 02/03/2018 1018  HCT 44.7 02/03/2018 1018   PLT 238 02/03/2018 1018   MCV 85.1 02/03/2018 1018   MCH 28.2 02/03/2018 1018   MCHC 33.1 02/03/2018 1018   RDW 12.7 02/03/2018 1018   LYMPHSABS 1.0 06/03/2007 1550   MONOABS 0.4 06/03/2007 1550   EOSABS 0.0 06/03/2007 1550   BASOSABS 0.0 06/03/2007 1550    No results found for: POCLITH, LITHIUM   No results found for: PHENYTOIN, PHENOBARB, VALPROATE, CBMZ   .res Assessment: Plan:   Will continue current medications for depression since pt reports that he would prefer not to change plan of care since depressive s/s are in remission and he is not having tolerability issues.  Pt reports that he would prefer that medications not be sent at this time and instead be refilled when requested by mail order.  Pt to f/u in 6 months or sooner if clinically indicated. Patient advised to contact office with any questions, adverse effects, or acute worsening in signs and symptoms.  Keeshawn was seen today for follow-up.  Diagnoses and all orders for this visit:  Major depressive disorder, recurrent episode, in full remission ([place]) Comments: Chronic, stable     Please see After Visit Summary for patient specific instructions.  Future Appointments  Date Time Provider [place]  09/30/2019  9:00 AM Thayer Headings, PMHNP [facility] None    No orders of the defined types were placed in this encounter.     -------------------------------

## 2019-04-01 NOTE — Progress Notes (Signed)
   04/01/19 1317  Facial and Oral Movements  Muscles of Facial Expression 0  Lips and Perioral Area 0  Jaw 0  Tongue 0  Extremity Movements  Upper (arms, wrists, hands, fingers) 0  Lower (legs, knees, ankles, toes) 0  Trunk Movements  Neck, shoulders, hips 0  Overall Severity  Severity of abnormal movements (highest score from questions above) 0  Incapacitation due to abnormal movements 0  Patient's awareness of abnormal movements (rate only patient's report) 0  Dental Status  Current problems with teeth and/or dentures? No  Does patient usually wear dentures? No  AIMS Total Score  AIMS Total Score 0

## 2019-05-24 ENCOUNTER — Other Ambulatory Visit: Payer: Self-pay | Admitting: Psychiatry

## 2019-05-24 DIAGNOSIS — F3342 Major depressive disorder, recurrent, in full remission: Secondary | ICD-10-CM

## 2019-05-24 DIAGNOSIS — F411 Generalized anxiety disorder: Secondary | ICD-10-CM

## 2019-06-06 ENCOUNTER — Other Ambulatory Visit: Payer: Self-pay | Admitting: Psychiatry

## 2019-06-08 ENCOUNTER — Other Ambulatory Visit: Payer: Self-pay

## 2019-06-08 DIAGNOSIS — F3342 Major depressive disorder, recurrent, in full remission: Secondary | ICD-10-CM

## 2019-06-08 MED ORDER — QUETIAPINE FUMARATE ER 200 MG PO TB24: ORAL_TABLET | ORAL | 2 refills | Status: DC

## 2019-07-13 ENCOUNTER — Other Ambulatory Visit: Payer: Self-pay | Admitting: Psychiatry

## 2019-07-13 DIAGNOSIS — F411 Generalized anxiety disorder: Secondary | ICD-10-CM

## 2019-07-13 NOTE — Telephone Encounter (Signed)
Next appt 09/2019

## 2019-08-18 ENCOUNTER — Telehealth: Payer: Self-pay | Admitting: Psychiatry

## 2019-08-18 ENCOUNTER — Other Ambulatory Visit: Payer: Self-pay

## 2019-08-18 MED ORDER — LAMOTRIGINE 150 MG PO TABS: 150.0000 mg | ORAL_TABLET | Freq: Every day | ORAL | 0 refills | Status: DC

## 2019-08-18 NOTE — Telephone Encounter (Signed)
2 week supply submitted to local pharmacy for Lamotrigine 150 mg 1/daily

## 2019-08-18 NOTE — Telephone Encounter (Signed)
Pt rx for mail order was lost in shipping. Pt wants to know if he can get a week supply of Lamotrigine sent in at [facility] in [place]. Pt is completely out.

## 2019-09-30 ENCOUNTER — Ambulatory Visit: Payer: 59 | Admitting: Psychiatry

## 2019-10-07 ENCOUNTER — Encounter: Payer: Self-pay | Admitting: Psychiatry

## 2019-10-07 ENCOUNTER — Ambulatory Visit (INDEPENDENT_AMBULATORY_CARE_PROVIDER_SITE_OTHER): Payer: 59 | Admitting: Psychiatry

## 2019-10-07 DIAGNOSIS — F411 Generalized anxiety disorder: Secondary | ICD-10-CM

## 2019-10-07 DIAGNOSIS — F3342 Major depressive disorder, recurrent, in full remission: Secondary | ICD-10-CM

## 2019-10-07 MED ORDER — QUETIAPINE FUMARATE ER 200 MG PO TB24: ORAL_TABLET | ORAL | 2 refills | Status: DC

## 2019-10-07 MED ORDER — LAMOTRIGINE 150 MG PO TABS: 150.0000 mg | ORAL_TABLET | Freq: Every day | ORAL | 2 refills | Status: DC

## 2019-10-07 MED ORDER — PAROXETINE HCL 40 MG PO TABS: 40.0000 mg | ORAL_TABLET | Freq: Every day | ORAL | 1 refills | Status: DC

## 2019-10-07 NOTE — Progress Notes (Signed)
[name] [id number] 1956/08/07 64 y.o.  Virtual Visit via Telephone Note  I connected with pt on 10/07/19 at 12:30 PM EST by telephone and verified that I am speaking with the correct person using two identifiers.   I discussed the limitations, risks, security and privacy concerns of performing an evaluation and management service by telephone and the availability of in person appointments. I also discussed with the patient that there may be a patient responsible charge related to this service. The patient expressed understanding and agreed to proceed.   I discussed the assessment and treatment plan with the patient. The patient was provided an opportunity to ask questions and all were answered. The patient agreed with the plan and demonstrated an understanding of the instructions.   The patient was advised to call back or seek an in-person evaluation if the symptoms worsen or if the condition fails to improve as anticipated.  I provided 15 minutes of non-face-to-face time during this encounter.  The patient was located at home.  The provider was located at [place].   Thayer Headings, PMHNP   Subjective:   Patient ID:  Adrian Mclaughlin is a 64 y.o. (DOB 07-08-1956) male.  Chief Complaint:  Chief Complaint  Patient presents with  . Follow-up    h/o depression and anxiety    HPI Adrian Mclaughlin presents for follow-up of mood. He reports that he has been doing well overall. Denies any depression. He reports that he had some anxiety with traveling to [place] to go caving. He reports that he has been sleeping well with Seroquel. He notices that he has difficulty falling asleep if he forgets to take Seroquel. Appetite has been good. He reports that his energy has been good. Motivation is good. He reports that concentration has been good. Denies SI.   He continues to work remotely. He reports that this has been working well for him and has allowed him more time with  family. Has a cabin in [place]. Planning for retirement.   He reports having 2-3 beers a night.   He reports that he has seldom used Xanax prn, usually for difficulty sleeping while traveling.   Past Psychiatric Medication Trials: Paxil Zoloft Cymbalta Prozac Effexor Abilify Seroquel XR Lamictal Alprazolam  Review of Systems:  Review of Systems  Genitourinary:       Recently slightly elevated PSA.   Musculoskeletal: Negative for gait problem.  Neurological: Negative for tremors.  Psychiatric/Behavioral:       Please refer to HPI   He had recently physical exam and labs are WNL. He reports that his cholesterol is WNL.  Medications:  Current Outpatient Medications  Medication Sig Dispense Refill  . ALPRAZolam (XANAX) 0.5 MG tablet TAKE 1 TABLET BY MOUTH EVERY DAY AS NEEDED 30 tablet 0  . atenolol (TENORMIN) 50 MG tablet Take 50 mg by mouth daily.    Marland Kitchen lamoTRIgine (LAMICTAL) 150 MG tablet Take 1 tablet (150 mg total) by mouth daily. 90 tablet 2  . PARoxetine (PAXIL) 40 MG tablet Take 1 tablet (40 mg total) by mouth daily. 90 tablet 1  . QUEtiapine (SEROQUEL XR) 200 MG 24 hr tablet TAKE 1 TABLET BY MOUTH  DAILY AT 6 PM 90 tablet 2  . quinapril-hydrochlorothiazide (ACCURETIC) 20-25 MG tablet Take 1 tablet by mouth daily.      No current facility-administered medications for this visit.    Medication Side Effects: None  Allergies: No Known Allergies  Past Medical History:  Diagnosis Date  .  Anxiety   . Depression   . History of kidney stones   . Hypertension   . Mucoid cyst of joint    left long finger    Family History  Problem Relation Age of Onset  . Schizophrenia Maternal Aunt   . Autism Son   . Anxiety disorder Son   . Panic disorder Son     Social History   Socioeconomic History  . Marital status: Married    Spouse name: Not on file  . Number of children: Not on file  . Years of education: Not on file  . Highest education level: Not on file   Occupational History  . Not on file  Tobacco Use  . Smoking status: Never Smoker  . Smokeless tobacco: Never Used  Substance and Sexual Activity  . Alcohol use: Yes    Comment: 2-3 drinks a night  . Drug use: Never  . Sexual activity: Not on file  Other Topics Concern  . Not on file  Social History Narrative  . Not on file   Social Determinants of Health   Financial Resource Strain:   . Difficulty of Paying Living Expenses: Not on file  Food Insecurity:   . Worried About Charity fundraiser in the Last Year: Not on file  . Ran Out of Food in the Last Year: Not on file  Transportation Needs:   . Lack of Transportation (Medical): Not on file  . Lack of Transportation (Non-Medical): Not on file  Physical Activity:   . Days of Exercise per Week: Not on file  . Minutes of Exercise per Session: Not on file  Stress:   . Feeling of Stress : Not on file  Social Connections:   . Frequency of Communication with Friends and Family: Not on file  . Frequency of Social Gatherings with Friends and Family: Not on file  . Attends Religious Services: Not on file  . Active Member of Clubs or Organizations: Not on file  . Attends Archivist Meetings: Not on file  . Marital Status: Not on file  Intimate Partner Violence:   . Fear of Current or Ex-Partner: Not on file  . Emotionally Abused: Not on file  . Physically Abused: Not on file  . Sexually Abused: Not on file    Past Medical History, Surgical history, Social history, and Family history were reviewed and updated as appropriate.   Please see review of systems for further details on the patient's review from today.   Objective:   Physical Exam:  BP 114/69   Pulse (!) 53   Wt 185 lb (83.9 kg)   BMI 25.09 kg/m   Physical Exam Constitutional:      General: He is not in acute distress.    Appearance: He is well-developed.  Neurological:     Mental Status: He is alert and oriented to person, place, and time.      Cranial Nerves: No dysarthria.     Coordination: Coordination normal.  Psychiatric:        Attention and Perception: Attention and perception normal. He does not perceive auditory or visual hallucinations.        Mood and Affect: Mood normal. Mood is not anxious or depressed. Affect is not labile, blunt, angry or inappropriate.        Speech: Speech normal.        Behavior: Behavior normal. Behavior is cooperative.        Thought Content: Thought content normal. Thought  content is not paranoid or delusional. Thought content does not include homicidal or suicidal ideation. Thought content does not include homicidal or suicidal plan.        Cognition and Memory: Cognition and memory normal.        Judgment: Judgment normal.     Comments: Insight intact     Lab Review:     Component Value Date/Time   NA 137 02/03/2018 1018   K 4.4 02/03/2018 1018   CL 100 (L) 02/03/2018 1018   CO2 30 02/03/2018 1018   GLUCOSE 89 02/03/2018 1018   BUN 12 02/03/2018 1018   CREATININE 0.97 02/03/2018 1018   CALCIUM 10.7 (H) 02/03/2018 1018   GFRNONAA >60 02/03/2018 1018   GFRAA >60 02/03/2018 1018       Component Value Date/Time   WBC 5.3 02/03/2018 1018   RBC 5.25 02/03/2018 1018   HGB 14.8 02/03/2018 1018   HCT 44.7 02/03/2018 1018   PLT 238 02/03/2018 1018   MCV 85.1 02/03/2018 1018   MCH 28.2 02/03/2018 1018   MCHC 33.1 02/03/2018 1018   RDW 12.7 02/03/2018 1018   LYMPHSABS 1.0 06/03/2007 1550   MONOABS 0.4 06/03/2007 1550   EOSABS 0.0 06/03/2007 1550   BASOSABS 0.0 06/03/2007 1550    No results found for: POCLITH, LITHIUM   No results found for: PHENYTOIN, PHENOBARB, VALPROATE, CBMZ   .res Assessment: Plan:   Will continue current plan of care since target signs and symptoms are well controlled without any tolerability issues and pt reports that recent labs were WNL to include cholesterol and glucose. Pt reports that he does not need a refill of Xanax at this time and will contact  office if he needs a refill prior to next apt.  Pt to f/u in 6 months or sooner if clinically indicated.  Patient advised to contact office with any questions, adverse effects, or acute worsening in signs and symptoms.  Tyshaun was seen today for follow-up.  Diagnoses and all orders for this visit:  Major depressive disorder, recurrent episode, in full remission ([place]) Comments: Chronic, stable Orders: -     QUEtiapine (SEROQUEL XR) 200 MG 24 hr tablet; TAKE 1 TABLET BY MOUTH  DAILY AT 6 PM -     PARoxetine (PAXIL) 40 MG tablet; Take 1 tablet (40 mg total) by mouth daily. -     lamoTRIgine (LAMICTAL) 150 MG tablet; Take 1 tablet (150 mg total) by mouth daily.  Anxiety state Comments: Stable. Chronic Orders: -     PARoxetine (PAXIL) 40 MG tablet; Take 1 tablet (40 mg total) by mouth daily.    Please see After Visit Summary for patient specific instructions.  No future appointments.  No orders of the defined types were placed in this encounter.     -------------------------------

## 2019-11-23 ENCOUNTER — Other Ambulatory Visit: Payer: Self-pay | Admitting: Psychiatry

## 2019-11-23 DIAGNOSIS — F411 Generalized anxiety disorder: Secondary | ICD-10-CM

## 2019-11-23 NOTE — Telephone Encounter (Signed)
Next apt 03/2020 refills?

## 2019-12-05 ENCOUNTER — Ambulatory Visit: Payer: Self-pay | Admitting: Orthopedic Surgery

## 2019-12-05 NOTE — H&P (Signed)
Subjective:   For associated symptoms, patient reports radiation down leg. For location, he reports left. For quality, he reports aching and stabbing. For severity, he reports pain level 7/10. For duration, he reports and 6 weeks. For timing, he reports acute. For context, he reports lifting and twisting. For alleviating factors, he reports narcotics. For aggravating factors, he reports standing and walking. For previous surgery, he reports none. For prior imaging, he reports mri (11-12-19 @ [facility]). For previous injections, he reports none. For previous pt, he reports none. For work related, he reports no. For working, he reports regular duty.   Patient Active Problem List   Diagnosis Date Noted  . Anxiety 08/26/2018  . Major depressive disorder, recurrent episode, in full remission ([place]) 08/26/2018  . HNP (herniated nucleus pulposus) with myelopathy, cervical 02/11/2018  . DEPRESSION 12/17/2007  . HYPERTENSION 12/17/2007  . COLONIC POLYPS, HX OF 12/17/2007  . NEPHROLITHIASIS, HX OF 12/17/2007  . SPINAL STENOSIS, CERVICAL 12/15/2007  . BACK PAIN 12/15/2007   Past Medical History:  Diagnosis Date  . Anxiety   . Depression   . History of kidney stones   . Hypertension   . Mucoid cyst of joint    left long finger    Past Surgical History:  Procedure Laterality Date  . ANTERIOR CERVICAL DECOMP/DISCECTOMY FUSION N/A 02/11/2018   Procedure: ANTERIOR CERVICAL DECOMPRESSION/DISCECTOMY FUSION CERVICAL THREE-FOUR, FOUR-FIVE;  Surgeon: Jovita Gamma, MD;  Location: [place];  Service: Neurosurgery;  Laterality: N/A;  ANTERIOR CERVICAL DECOMPRESSION/DISCECTOMY FUSION CERVICAL THREE-FOUR, FOUR-FIVE  . FOREARM FRACTURE SURGERY Left   . MASS EXCISION Left 04/15/2016   Procedure: LEFT LONG FINGER EXCISION MASS;  Surgeon: Leanora Cover, MD;  Location: [place];  Service: Orthopedics;  Laterality: Left;  . SUBACROMIAL DECOMPRESSION Left   . TONSILLECTOMY      Current Outpatient  Medications  Medication Sig Dispense Refill Last Dose  . ALPRAZolam (XANAX) 0.5 MG tablet TAKE 1 TABLET BY MOUTH EVERY DAY AS NEEDED 30 tablet 0   . atenolol (TENORMIN) 50 MG tablet Take 50 mg by mouth daily.     Marland Kitchen lamoTRIgine (LAMICTAL) 150 MG tablet Take 1 tablet (150 mg total) by mouth daily. 90 tablet 2   . PARoxetine (PAXIL) 40 MG tablet Take 1 tablet (40 mg total) by mouth daily. 90 tablet 1   . QUEtiapine (SEROQUEL XR) 200 MG 24 hr tablet TAKE 1 TABLET BY MOUTH  DAILY AT 6 PM 90 tablet 2   . quinapril-hydrochlorothiazide (ACCURETIC) 20-25 MG tablet Take 1 tablet by mouth daily.       No current facility-administered medications for this visit.   No Known Allergies  Social History   Tobacco Use  . Smoking status: Never Smoker  . Smokeless tobacco: Never Used  Substance Use Topics  . Alcohol use: Yes    Comment: 2-3 drinks a night    Family History  Problem Relation Age of Onset  . Schizophrenia Maternal Aunt   . Autism Son   . Anxiety disorder Son   . Panic disorder Son     Review of Systems As stated in HPI  Objective:   General: AAOX3, well developed and well nourished, NAD  Ambulation: antalgic gait pattern, uses cane assistive device.  Inspection: No obvious deformity, scoleosis, kyphosis, loss of lordotic curve.  Palpation: Non-tender over spinous processes and paraspinal muscles.  AROM: - Knee: flexion and extension normal and pain free bilaterally. - Ankle: Dorsiflexion, plantarflexion, inversion, eversion normal and pain free.  Dermatomes: Lower extremity sensation to light touch abnormal in left L4 dermatome pattern  Myotomes:  - Hip Flexion: Left 5/5, Right 5/5 - Knee Extension: Left 5/5, Right 5/5 - Ankle Dorsiflextion: Left 4/5, Right 5/5 - Ankle Plantarflexion: Left 5/5, Right 5/5  Reflexes:  - Patella: Left2+, Right 2+ - Achilles: Left2+, Right 2+ - Babinski: Left Ngative, Right Negative - Clonus: Negative  Special Tests: Positive nerve  tension signs on Left  PV: Extremities warm and well profused. Posterior and dorsalis pedis pulse 2+ bilaterally, No pitting Edema, discoloration, calf tenderness  MRI Impression: Lumbar MRI: completed on 11/12/19 was reviewed with the patient. [facility] was completed at [facility]; I have independently reviewed the images as well as the radiology report. Left foraminal disc herniation at L4-5 with compression of the exiting L4 nerve root. He has severe left foraminal stenosis secondary to the disc herniation. No fracture or significant stenosis is noted. Patient has a small right-sided disc protrusion at L5-S1. He also has advanced degenerative disc disease L1-2 and L2-3. He has a small facet cyst at L3-4 producing mild to moderate central and foraminal stenosis. No fracture or abnormal marrow signal changes noted.     Assessment:   Clinical exam: Valiant's a very pleasant 64 year old gentleman who is referred partner Dr. Tonita Cong for a 5 week history of severe radicular leg pain. Patient notes significant left leg pain which is significantly interfered with his quality-of-life. Patient has a positive nerve root tension sign with reproduction of dysesthesias and pain in the L4 dermatome.   Lumbar MRI: completed on 11/12/19 was reviewed with the patient. It was completed at [facility]; I have independently reviewed the images as well as the radiology report. Left foraminal disc herniation at L4-5 with compression of the exiting L4 nerve root. He has severe left foraminal stenosis secondary to the disc herniation. No fracture or significant stenosis is noted. Patient has a small right-sided disc protrusion at L5-S1. He also has advanced degenerative disc disease L1-2 and L2-3. He has a small facet cyst at L3-4 producing mild to moderate central and foraminal stenosis. No fracture or abnormal marrow signal changes noted.      Diagnosis: Adrian Mclaughlin is a pleasant 64 year old gentleman with a 5 week history of  severe radicular left leg pain. Imaging studies and clinical exam are consistent with L4 radiculopathy with neuropathic pain. At this point we briefly talked about injection therapy but given the size of the disc herniation on less than optimistic that it will improve with the injection. We have gone over the surgical procedure which would be a foraminal discectomy, and I spoken with his wife on the phone. We reviewed the risks and benefits and all of his questions were encouraged and addressed. Risks and benefits of decompression/discectomy: Infection, bleeding, death, stroke, paralysis, ongoing or worse pain, need for additional surgery, leak of spinal fluid, adjacent segment degeneration requiring additional surgery, post-operative hematoma formation that can result in neurological compromise and the need for urgent/emergent re-operation. Loss in bowel and bladder control. Injury to major vessels that could result in the need for urgent abdominal surgery to stop bleeding. Risk of deep venous thrombosis (DVT) and the need for additional treatment. Recurrent disc herniation resulting in the need for revision surgery, which could include fusion surgery (utilizing instrumentation such as pedicle screws and intervertebral cages). Foraminal discectomy also increases the potential risk for injury to the dorsal root ganglion which can result in complex regional pain syndrome.   Plan:  We will plan on moving forward with a left L4-5 foraminal discectomy in the near future. We will of course get preoperative medical clearance from his primary care physician.

## 2019-12-08 ENCOUNTER — Ambulatory Visit: Payer: Self-pay | Admitting: Orthopedic Surgery

## 2019-12-08 NOTE — H&P (Signed)
Subjective:   Adrian Mclaughlin is a pleasant 64 year old male With past medical history significant for hypertension who was in his normal state of health until February. He was lifting a 5 gallon drum when this injury occurred, and he had immediate pain. He reports really not much pain in the back but in the buttock and down the left lateral leg to mid/distal calf. There is some tingling and burning associated but mostly severe pain. He denies weakness. He is more comfortable sitting or when he is upright needs to lean over forward for improvement. He has difficulty sleeping at night due to pain. He reports some back pain when he was younger but never radicular symptoms. He continues to have this pain despite conservative treatment including a Medrol Dosepak and narcotic medications. He was previously on gabapentin, but this was not significantly helpful. In any studies of the lumbar spine show a large left L4-5 disc herniation. Given the size of this disc herniation, we previously have discussed that I do not think an injection is likely to be helpful. And therefore, the patient has elected to move forward with surgical intervention. He is scheduled for Far Lateral Discectomy L4-5 Left on 12/14/19 At no [facility] with Dr. Rolena Infante.  Patient Active Problem List   Diagnosis Date Noted  . Anxiety 08/26/2018  . Major depressive disorder, recurrent episode, in full remission ([place]) 08/26/2018  . HNP (herniated nucleus pulposus) with myelopathy, cervical 02/11/2018  . DEPRESSION 12/17/2007  . HYPERTENSION 12/17/2007  . COLONIC POLYPS, HX OF 12/17/2007  . NEPHROLITHIASIS, HX OF 12/17/2007  . SPINAL STENOSIS, CERVICAL 12/15/2007  . BACK PAIN 12/15/2007   Past Medical History:  Diagnosis Date  . Anxiety   . Depression   . History of kidney stones   . Hypertension   . Mucoid cyst of joint    left long finger    Past Surgical History:  Procedure Laterality Date  . ANTERIOR CERVICAL DECOMP/DISCECTOMY FUSION  N/A 02/11/2018   Procedure: ANTERIOR CERVICAL DECOMPRESSION/DISCECTOMY FUSION CERVICAL THREE-FOUR, FOUR-FIVE;  Surgeon: Jovita Gamma, MD;  Location: [place];  Service: Neurosurgery;  Laterality: N/A;  ANTERIOR CERVICAL DECOMPRESSION/DISCECTOMY FUSION CERVICAL THREE-FOUR, FOUR-FIVE  . FOREARM FRACTURE SURGERY Left   . MASS EXCISION Left 04/15/2016   Procedure: LEFT LONG FINGER EXCISION MASS;  Surgeon: Leanora Cover, MD;  Location: [place];  Service: Orthopedics;  Laterality: Left;  . SUBACROMIAL DECOMPRESSION Left   . TONSILLECTOMY      Current Outpatient Medications  Medication Sig Dispense Refill Last Dose  . ALPRAZolam (XANAX) 0.5 MG tablet TAKE 1 TABLET BY MOUTH EVERY DAY AS NEEDED (Patient taking differently: Take 0.5 mg by mouth daily as needed for anxiety. ) 30 tablet 0   . atenolol (TENORMIN) 50 MG tablet Take 50 mg by mouth 2 (two) times daily.      Marland Kitchen ibuprofen (ADVIL) 800 MG tablet Take 800 mg by mouth 3 (three) times daily.     Marland Kitchen lamoTRIgine (LAMICTAL) 150 MG tablet Take 1 tablet (150 mg total) by mouth daily. 90 tablet 2   . oxyCODONE-acetaminophen (PERCOCET/ROXICET) 5-325 MG tablet Take 1 tablet by mouth 3 (three) times daily as needed for pain.     Marland Kitchen PARoxetine (PAXIL) 40 MG tablet Take 1 tablet (40 mg total) by mouth daily. 90 tablet 1   . QUEtiapine (SEROQUEL XR) 200 MG 24 hr tablet TAKE 1 TABLET BY MOUTH  DAILY AT 6 PM (Patient taking differently: Take 200 mg by mouth every evening. ) 90  tablet 2   . quinapril-hydrochlorothiazide (ACCURETIC) 20-25 MG tablet Take 1 tablet by mouth daily.       No current facility-administered medications for this visit.   No Known Allergies  Social History   Tobacco Use  . Smoking status: Never Smoker  . Smokeless tobacco: Never Used  Substance Use Topics  . Alcohol use: Yes    Comment: 2-3 drinks a night    Family History  Problem Relation Age of Onset  . Schizophrenia Maternal Aunt   . Autism Son   . Anxiety disorder  Son   . Panic disorder Son     Review of Systems As stated in HPI  Objective:   Vitals: Ht: 5 ft 11 in 12/08/2019 08:14 am Wt: 185 lbs 12/08/2019 08:15 am BMI: 25.8 12/08/2019 08:15 am BP: 114/69 sitting L arm 12/08/2019 08:17 am Pulse: 61 bpm 12/08/2019 08:17 am  General: AAOX3, well developed and well nourished, NAD  Ambulation: antalgic gait pattern, uses cane assistive device.  Heart: Regular rate and rhythm, no rubs, murmurs, or gallops  Lungs: Clear to auscultation bilaterally  Abdomen: Bowel sounds 4, soft, nondistended nontender. No rebound tenderness. No loss of bladder or bowel control.  Inspection: No obvious deformity, scoleosis, kyphosis, loss of lordotic curve.  AROM: - Knee: flexion and extension normal and pain free bilaterally. - Ankle: Dorsiflexion, plantarflexion, inversion, eversion normal and pain free.  Dermatomes: Lower extremity sensation to light touch abnormal in left L4 dermatome pattern  Myotomes: - Hip Flexion: Left 5/5, Right 5/5 - Knee Extension: Left 5/5, Right 5/5 - Ankle Dorsiflextion: Left 4/5, Right 5/5 - Ankle Plantarflexion: Left 5/5, Right 5/5  Reflexes: - Patella: Left2+, Right 2+ - Achilles: Left2+, Right 2+ - Babinski: Left Ngative, Right Negative - Clonus: Negative  Special Tests: Positive nerve tension signs on Left  PV: Extremities warm and well profused. Posterior and dorsalis pedis pulse 2+ bilaterally, No pitting Edema, discoloration, calf tenderness  MRI Impression: Lumbar MRI completed on 11/12/19 was reviewed with the patient. [facility] was completed at [facility]; I have independently reviewed the images as well as the radiology report. Left foraminal disc herniation at L4-5 with compression of the exiting L4 nerve root. He has severe left foraminal stenosis secondary to the disc herniation. No fracture or significant stenosis is noted. Patient has a small right-sided disc protrusion at L5-S1. He also has advanced  degenerative disc disease L1-2 and L2-3. He has a small facet cyst at L3-4 producing mild to moderate central and foraminal stenosis. No fracture or abnormal marrow signal changes noted.  Assessment:    Adrian Mclaughlin is a pleasant 64 year old male With past medical history significant for hypertension who was in his normal state of health until 7 weeks ago. He was lifting a 5 gallon drum when this injury occurred, and he had immediate pain. He reports really not much pain in the back but He reports severe radicular leg pain in the left L4 dermatome pattern. Physical exam also shows left EHL weakness (4/5) and positive straight leg raise tests. These findings do correlate with MRI studies that do demonstrate a large far lateral left disc herniation. Patient has undergone conservative treatment including a Medrol Dosepak, narcotic medications, and a trial of gabapentin; despite this he continues to have severe pain that is altering his quality-of-life as well as motor deficits. Furthermore, given the size of the disc herniation I think it is unlikely that injection therapy would be significantly helpful. Therefore, the patient has elected to move  forward with surgical intervention which I do think is reasonable.   Plan:   Far lateral discectomy L4-5 (left) at [facility] on 12/14/2019 pending preoperative testing.   We have gone over the surgical procedure which would be a foraminal discectomy. We reviewed the risks and benefits and all of his questions were encouraged and addressed. Risks and benefits of decompression/discectomy: Infection, bleeding, death, stroke, paralysis, ongoing or worse pain, need for additional surgery, leak of spinal fluid, adjacent segment degeneration requiring additional surgery, post-operative hematoma formation that can result in neurological compromise and the need for urgent/emergent re-operation. Loss in bowel and bladder control. Injury to major vessels that could result in  the need for urgent abdominal surgery to stop bleeding. Risk of deep venous thrombosis (DVT) and the need for additional treatment. Recurrent disc herniation resulting in the need for revision surgery, which could include fusion surgery (utilizing instrumentation such as pedicle screws and intervertebral cages). Foraminal discectomy also increases the potential risk for injury to the dorsal root ganglion which can result in complex regional pain syndrome.   We have also discussed the goals of surgery to include:  Goals of surgery: Reduction in pain, and improvement in quality of life.   We have also discussed the post-operative recovery period to include: bathing/showering restrictions, wound healing, activity (and driving) restrictions, medications/pain mangement.   We have also discussed post-operative redflags to include: signs and symptoms of postoperative infection, DVT/PE.   I have reviewed the patient's medication list with him. He is not on any blood thinners, he does not take any aspirin. He is currently using anti-inflammatory medication which I have told him he must discontinue 7 days prior to surgery. He did express understanding of this. He may take his oxycodone/acetaminophen and/or Tylenol as needed for pain leading up to surgery.   We have received preoperative clearance from the patient's PCP.   Patient was supplied an LSO brace at today's visit.   All patients questions were invited and answered.   Follow-up: 2 weeks after surgery

## 2019-12-09 NOTE — Pre-Procedure Instructions (Signed)
LATRAIL QAMAR  12/09/2019    Your procedure is scheduled on Wednesday, December 14, 2019 at 12:25 PM.   Report to [facility] Entrance "A" Admitting Office at 10:25 AM.   Call this number if you have problems the morning of surgery: [phone]   Questions prior to day of surgery, please call [phone] between 8 & 4 PM.   Remember:  Do not eat or drink after midnight Tuesday, 12/13/19.  Take these medicines the morning of surgery with A SIP OF WATER: Atenolol (Tenormin), Lamotrigine (Lamictal), Paroxetine (Paxil), Alprazolam (Xanax) - if needed, Oxycodone (Percocet) - if needed  Do not use Aspirin products (Goody's, BC Powders, etc), NSAIDS (Ibuprofen, Aleve, etc), Multivitamins, Herbal medications or Fish Oil prior to surgery.    Do not wear jewelry.  Do not wear lotions, powders, cologne or deodorant.  Men may shave face and neck.  Do not bring valuables to the hospital.  [facility] is not responsible for any belongings or valuables.  Contacts, dentures or bridgework may not be worn into surgery.  Leave your suitcase in the car.  After surgery it may be brought to your room.  For patients admitted to the hospital, discharge time will be determined by your treatment team.  Patients discharged the day of surgery will not be allowed to drive home.   [facility] - Preparing for Surgery  Before surgery, you can play an important role.  Because skin is not sterile, your skin needs to be as free of germs as possible.  You can reduce the number of germs on you skin by washing with CHG (chlorahexidine gluconate) soap before surgery.  CHG is an antiseptic cleaner which kills germs and bonds with the skin to continue killing germs even after washing.  Oral Hygiene is also important in reducing the risk of infection.  Remember to brush your teeth with your regular toothpaste the morning of surgery.  Please DO NOT use if you have an allergy to CHG or antibacterial soaps.  If your  skin becomes reddened/irritated stop using the CHG and inform your nurse when you arrive at Short Stay.  Do not shave (including legs and underarms) for at least 48 hours prior to the first CHG shower.  You may shave your face.  Please follow these instructions carefully:   1.  Shower with CHG Soap the night before surgery and the morning of Surgery.  2.  If you choose to wash your hair, wash your hair first as usual with your normal shampoo.  3.  After you shampoo, rinse your hair and body thoroughly to remove the shampoo. 4.  Use CHG as you would any other liquid soap.  You can apply chg directly to the skin and wash gently with a      scrungie or washcloth.           5.  Apply the CHG Soap to your body ONLY FROM THE NECK DOWN.   Do not use on open wounds or open sores. Avoid contact with your eyes, ears, mouth and genitals (private parts).  Wash genitals (private parts) with your normal soap - do this prior to using CHG soap.  6.  Wash thoroughly, paying special attention to the area where your surgery will be performed.  7.  Thoroughly rinse your body with warm water from the neck down.  8.  DO NOT shower/wash with your normal soap after using and rinsing off the CHG Soap.  9.  Fraser Din  yourself dry with a clean towel.            10.  Wear clean pajamas.            11.  Place clean sheets on your bed the night of your first shower and do not sleep with pets.  Day of Surgery  Shower as above.  Do not apply any lotions/deodorants the morning of surgery.   Please wear clean clothes to the hospital. Remember to brush your teeth with toothpaste.   Please read over the fact sheets that you were given.

## 2019-12-12 ENCOUNTER — Other Ambulatory Visit: Payer: Self-pay

## 2019-12-12 ENCOUNTER — Encounter (HOSPITAL_COMMUNITY): Payer: Self-pay

## 2019-12-12 ENCOUNTER — Encounter (HOSPITAL_COMMUNITY)
Admission: RE | Admit: 2019-12-12 | Discharge: 2019-12-12 | Disposition: A | Discharge status: Home / Self Care | Payer: 59 | Source: Ambulatory Visit | Attending: Orthopedic Surgery | Admitting: Orthopedic Surgery

## 2019-12-12 ENCOUNTER — Ambulatory Visit (HOSPITAL_COMMUNITY)
Admission: RE | Admit: 2019-12-12 | Discharge: 2019-12-12 | Disposition: A | Discharge status: Home / Self Care | Payer: 59 | Source: Ambulatory Visit | Attending: Orthopedic Surgery | Admitting: Orthopedic Surgery

## 2019-12-12 ENCOUNTER — Other Ambulatory Visit (HOSPITAL_COMMUNITY)
Admission: RE | Admit: 2019-12-12 | Discharge: 2019-12-12 | Disposition: A | Discharge status: Home / Self Care | Payer: 59 | Source: Ambulatory Visit | Attending: Orthopedic Surgery | Admitting: Orthopedic Surgery

## 2019-12-12 DIAGNOSIS — F329 Major depressive disorder, single episode, unspecified: Secondary | ICD-10-CM | POA: Diagnosis not present

## 2019-12-12 DIAGNOSIS — F419 Anxiety disorder, unspecified: Secondary | ICD-10-CM | POA: Diagnosis not present

## 2019-12-12 DIAGNOSIS — Z981 Arthrodesis status: Secondary | ICD-10-CM | POA: Diagnosis not present

## 2019-12-12 DIAGNOSIS — Z01818 Encounter for other preprocedural examination: Secondary | ICD-10-CM

## 2019-12-12 DIAGNOSIS — M5126 Other intervertebral disc displacement, lumbar region: Secondary | ICD-10-CM | POA: Diagnosis not present

## 2019-12-12 DIAGNOSIS — Z20822 Contact with and (suspected) exposure to covid-19: Secondary | ICD-10-CM | POA: Insufficient documentation

## 2019-12-12 DIAGNOSIS — Z87442 Personal history of urinary calculi: Secondary | ICD-10-CM | POA: Insufficient documentation

## 2019-12-12 DIAGNOSIS — Z79899 Other long term (current) drug therapy: Secondary | ICD-10-CM | POA: Diagnosis not present

## 2019-12-12 DIAGNOSIS — I10 Essential (primary) hypertension: Secondary | ICD-10-CM | POA: Diagnosis not present

## 2019-12-12 LAB — BASIC METABOLIC PANEL
Anion gap: 9 (ref 5–15)
BUN: 22 mg/dL (ref 8–23)
CO2: 29 mmol/L (ref 22–32)
Calcium: 9.9 mg/dL (ref 8.9–10.3)
Chloride: 95 mmol/L — ABNORMAL LOW (ref 98–111)
Creatinine, Ser: 1.26 mg/dL — ABNORMAL HIGH (ref 0.61–1.24)
GFR calc Af Amer: >60 mL/min (ref >60)
GFR calc non Af Amer: 60 mL/min — ABNORMAL LOW (ref >60)
Glucose, Bld: 103 mg/dL — ABNORMAL HIGH (ref 70–99)
Potassium: 4.2 mmol/L (ref 3.5–5.1)
Sodium: 133 mmol/L — ABNORMAL LOW (ref 135–145)

## 2019-12-12 LAB — APTT: aPTT: 27 seconds (ref 24–36)

## 2019-12-12 LAB — URINALYSIS, ROUTINE W REFLEX MICROSCOPIC
Bilirubin Urine: NEGATIVE
Glucose, UA: NEGATIVE mg/dL
Hgb urine dipstick: NEGATIVE
Ketones, ur: NEGATIVE mg/dL
Leukocytes,Ua: NEGATIVE
Nitrite: NEGATIVE
Protein, ur: NEGATIVE mg/dL
Specific Gravity, Urine: 1.012 (ref 1.005–1.030)
pH: 6 (ref 5.0–8.0)

## 2019-12-12 LAB — CBC
HCT: 40.3 % (ref 39.0–52.0)
Hemoglobin: 13.5 g/dL (ref 13.0–17.0)
MCH: 28.8 pg (ref 26.0–34.0)
MCHC: 33.5 g/dL (ref 30.0–36.0)
MCV: 86.1 fL (ref 80.0–100.0)
Platelets: 255 10*3/uL (ref 150–400)
RBC: 4.68 MIL/uL (ref 4.22–5.81)
RDW: 12.4 % (ref 11.5–15.5)
WBC: 6.5 10*3/uL (ref 4.0–10.5)
nRBC: 0 % (ref 0.0–0.2)

## 2019-12-12 LAB — SURGICAL PCR SCREEN
MRSA, PCR: NEGATIVE
Staphylococcus aureus: NEGATIVE

## 2019-12-12 LAB — PROTIME-INR
INR: 1 (ref 0.8–1.2)
Prothrombin Time: 13.5 seconds (ref 11.4–15.2)

## 2019-12-12 LAB — SARS CORONAVIRUS 2 (TAT 6-24 HRS): SARS Coronavirus 2: NEGATIVE

## 2019-12-12 NOTE — Progress Notes (Signed)
PCP - Dr. Elesa Hacker Cardiologist - denies  Chest x-ray - today EKG - today Stress Test - denies ECHO - denies Cardiac Cath - denies  COVID TEST- to be done today   Anesthesia review: yes, per Dr. Rolena Infante' order  Patient denies shortness of breath, fever, cough and chest pain at PAT appointment   All instructions explained to the patient, with a verbal understanding of the material. Patient agrees to go over the instructions while at home for a better understanding. Patient also instructed to self quarantine after being tested for COVID-19. The opportunity to ask questions was provided.

## 2019-12-13 NOTE — Progress Notes (Signed)
Anesthesia Chart Review:  Case: [id number] Date/Time: 12/14/19 1209   Procedure: Far lateral discectomy L4-5 left (Left ) - 3 hrs   Anesthesia type: General   Pre-op diagnosis: L4-5 left herniated disc lateral   Location: [facility] OR ROOM 04 / [place] OR   Surgeons: Melina Schools, MD      DISCUSSION: Patient is a 64 year old male scheduled for the above procedure.  History includes never smoker, HTN, C3-5 ACDF 02/11/18.   12/12/19 COVID-19 test negative. Preoperative EKG and labs acceptable for OR. He denied SOB, chest pain, cough, fever at PAT RN interview. Anesthesia team to evaluate on the day of surgery.    VS: BP 103/66   Pulse 60   Temp 36.4 C (Oral)   Resp 17   Ht 5\' 11"  (1.803 m)   Wt 83.9 kg   SpO2 98%   BMI 25.79 kg/m   PROVIDERS: Orpah Melter, MD is PCP    LABS: Labs reviewed: Acceptable for surgery. (all labs ordered are listed, but only abnormal results are displayed)  Labs Reviewed  BASIC METABOLIC PANEL - Abnormal; Notable for the following components:      Result Value   Sodium 133 (*)    Chloride 95 (*)    Glucose, Bld 103 (*)    Creatinine, Ser 1.26 (*)    GFR calc non Af Amer 60 (*)    All other components within normal limits  SURGICAL PCR SCREEN  APTT  CBC  PROTIME-INR  URINALYSIS, ROUTINE W REFLEX MICROSCOPIC     IMAGES: CXR 12/12/19: FINDINGS: The heart size and mediastinal contours are within normal limits. Both lungs are clear. The visualized skeletal structures are unremarkable. IMPRESSION: No active cardiopulmonary disease.   EKG: 12/12/19: NSR   CV: N/A  Past Medical History:  Diagnosis Date  . Anxiety   . Depression   . History of kidney stones   . Hypertension   . Mucoid cyst of joint    left long finger    Past Surgical History:  Procedure Laterality Date  . ANTERIOR CERVICAL DECOMP/DISCECTOMY FUSION N/A 02/11/2018   Procedure: ANTERIOR CERVICAL DECOMPRESSION/DISCECTOMY FUSION CERVICAL THREE-FOUR, FOUR-FIVE;  Surgeon:  Jovita Gamma, MD;  Location: [place];  Service: Neurosurgery;  Laterality: N/A;  ANTERIOR CERVICAL DECOMPRESSION/DISCECTOMY FUSION CERVICAL THREE-FOUR, FOUR-FIVE  . COLONOSCOPY    . FOREARM FRACTURE SURGERY Left   . MASS EXCISION Left 04/15/2016   Procedure: LEFT LONG FINGER EXCISION MASS;  Surgeon: Leanora Cover, MD;  Location: [place];  Service: Orthopedics;  Laterality: Left;  . SUBACROMIAL DECOMPRESSION Left   . TONSILLECTOMY      MEDICATIONS: . ALPRAZolam (XANAX) 0.5 MG tablet  . atenolol (TENORMIN) 50 MG tablet  . ibuprofen (ADVIL) 800 MG tablet  . lamoTRIgine (LAMICTAL) 150 MG tablet  . oxyCODONE-acetaminophen (PERCOCET/ROXICET) 5-325 MG tablet  . PARoxetine (PAXIL) 40 MG tablet  . QUEtiapine (SEROQUEL XR) 200 MG 24 hr tablet  . quinapril-hydrochlorothiazide (ACCURETIC) 20-25 MG tablet   No current facility-administered medications for this encounter.    Myra Gianotti, PA-C Surgical Short Stay/Anesthesiology [facility] Phone [phone] [facility] Phone [phone] 12/13/2019 9:30 AM

## 2019-12-13 NOTE — Anesthesia Preprocedure Evaluation (Addendum)
Anesthesia Evaluation  Patient identified by MRN, date of birth, ID band Patient awake    Reviewed: Allergy & Precautions, NPO status , Patient's Chart, lab work & pertinent test results  Airway Mallampati: II  TM Distance: >3 FB Neck ROM: Full    Dental  (+) Dental Advisory Given   Pulmonary neg pulmonary ROS,    breath sounds clear to auscultation       Cardiovascular hypertension, Pt. on medications and Pt. on home beta blockers  Rhythm:Regular Rate:Normal     Neuro/Psych negative neurological ROS     GI/Hepatic negative GI ROS, Neg liver ROS,   Endo/Other  negative endocrine ROS  Renal/GU negative Renal ROS     Musculoskeletal   Abdominal   Peds  Hematology negative hematology ROS (+)   Anesthesia Other Findings   Reproductive/Obstetrics                             Lab Results  Component Value Date   WBC 6.5 12/12/2019   HGB 13.5 12/12/2019   HCT 40.3 12/12/2019   MCV 86.1 12/12/2019   PLT 255 12/12/2019   Lab Results  Component Value Date   CREATININE 1.26 (H) 12/12/2019   BUN 22 12/12/2019   NA 133 (L) 12/12/2019   K 4.2 12/12/2019   CL 95 (L) 12/12/2019   CO2 29 12/12/2019    Anesthesia Physical Anesthesia Plan  ASA: II  Anesthesia Plan: General   Post-op Pain Management:    Induction: Intravenous  PONV Risk Score and Plan: 2 and Dexamethasone, Ondansetron and Treatment may vary due to age or medical condition  Airway Management Planned: Oral ETT  Additional Equipment:   Intra-op Plan:   Post-operative Plan: Extubation in OR  Informed Consent: I have reviewed the patients History and Physical, chart, labs and discussed the procedure including the risks, benefits and alternatives for the proposed anesthesia with the patient or authorized representative who has indicated his/her understanding and acceptance.     Dental advisory given  Plan Discussed  with: CRNA  Anesthesia Plan Comments: ( )      Anesthesia Quick Evaluation

## 2019-12-14 ENCOUNTER — Ambulatory Visit (HOSPITAL_COMMUNITY): Payer: 59 | Admitting: Anesthesiology

## 2019-12-14 ENCOUNTER — Ambulatory Visit (HOSPITAL_COMMUNITY)
Admission: RE | Admit: 2019-12-14 | Discharge: 2019-12-14 | Disposition: A | Discharge status: Home / Self Care | Payer: 59 | Attending: Orthopedic Surgery | Admitting: Orthopedic Surgery

## 2019-12-14 ENCOUNTER — Ambulatory Visit (HOSPITAL_COMMUNITY): Payer: 59 | Admitting: Vascular Surgery

## 2019-12-14 ENCOUNTER — Ambulatory Visit (HOSPITAL_COMMUNITY): Payer: 59

## 2019-12-14 ENCOUNTER — Encounter (HOSPITAL_COMMUNITY): Payer: Self-pay | Admitting: Orthopedic Surgery

## 2019-12-14 ENCOUNTER — Ambulatory Visit (HOSPITAL_COMMUNITY)
Admission: RE | Disposition: A | Discharge status: Home / Self Care | Payer: Self-pay | Source: Home / Self Care | Attending: Orthopedic Surgery

## 2019-12-14 ENCOUNTER — Other Ambulatory Visit: Payer: Self-pay

## 2019-12-14 DIAGNOSIS — Z86010 Personal history of colonic polyps: Secondary | ICD-10-CM | POA: Diagnosis not present

## 2019-12-14 DIAGNOSIS — Z981 Arthrodesis status: Secondary | ICD-10-CM | POA: Diagnosis not present

## 2019-12-14 DIAGNOSIS — F419 Anxiety disorder, unspecified: Secondary | ICD-10-CM | POA: Insufficient documentation

## 2019-12-14 DIAGNOSIS — I10 Essential (primary) hypertension: Secondary | ICD-10-CM | POA: Insufficient documentation

## 2019-12-14 DIAGNOSIS — M5116 Intervertebral disc disorders with radiculopathy, lumbar region: Secondary | ICD-10-CM | POA: Diagnosis not present

## 2019-12-14 DIAGNOSIS — Z818 Family history of other mental and behavioral disorders: Secondary | ICD-10-CM | POA: Diagnosis not present

## 2019-12-14 DIAGNOSIS — F329 Major depressive disorder, single episode, unspecified: Secondary | ICD-10-CM | POA: Diagnosis not present

## 2019-12-14 DIAGNOSIS — Z79899 Other long term (current) drug therapy: Secondary | ICD-10-CM | POA: Diagnosis not present

## 2019-12-14 DIAGNOSIS — Z419 Encounter for procedure for purposes other than remedying health state, unspecified: Secondary | ICD-10-CM

## 2019-12-14 DIAGNOSIS — Z87442 Personal history of urinary calculi: Secondary | ICD-10-CM | POA: Insufficient documentation

## 2019-12-14 HISTORY — PX: LUMBAR LAMINECTOMY/DECOMPRESSION MICRODISCECTOMY: SHX5026

## 2019-12-14 SURGERY — LUMBAR LAMINECTOMY/DECOMPRESSION MICRODISCECTOMY 1 LEVEL
Anesthesia: General | Site: Spine Lumbar | Laterality: Left

## 2019-12-14 MED ORDER — DEXAMETHASONE SODIUM PHOSPHATE 10 MG/ML IJ SOLN
INTRAMUSCULAR | Status: AC
  Filled 2019-12-14: qty 1

## 2019-12-14 MED ORDER — PHENYLEPHRINE 40 MCG/ML (10ML) SYRINGE FOR IV PUSH (FOR BLOOD PRESSURE SUPPORT)
PREFILLED_SYRINGE | INTRAVENOUS | Status: AC
  Filled 2019-12-14: qty 10

## 2019-12-14 MED ORDER — CEFAZOLIN SODIUM-DEXTROSE 2-4 GM/100ML-% IV SOLN
2.0000 g | INTRAVENOUS | Status: AC
  Administered 2019-12-14: 08:00:00 2 g via INTRAVENOUS
  Filled 2019-12-14: qty 100

## 2019-12-14 MED ORDER — GLYCOPYRROLATE 0.2 MG/ML IJ SOLN
INTRAMUSCULAR | Status: DC | PRN
  Administered 2019-12-14: .4 mg via INTRAVENOUS

## 2019-12-14 MED ORDER — METHOCARBAMOL 500 MG PO TABS: 500.0000 mg | ORAL_TABLET | Freq: Three times a day (TID) | ORAL | 0 refills | Status: AC | PRN

## 2019-12-14 MED ORDER — EPHEDRINE SULFATE 50 MG/ML IJ SOLN
INTRAMUSCULAR | Status: DC | PRN
  Administered 2019-12-14: 10 mg via INTRAVENOUS
  Administered 2019-12-14: 20 mg via INTRAVENOUS

## 2019-12-14 MED ORDER — FENTANYL CITRATE (PF) 100 MCG/2ML IJ SOLN
INTRAMUSCULAR | Status: DC | PRN
  Administered 2019-12-14: 100 ug via INTRAVENOUS

## 2019-12-14 MED ORDER — KETAMINE HCL 10 MG/ML IJ SOLN
INTRAMUSCULAR | Status: DC | PRN
  Administered 2019-12-14 (×2): 10 mg via INTRAVENOUS

## 2019-12-14 MED ORDER — FENTANYL CITRATE (PF) 100 MCG/2ML IJ SOLN
25.0000 ug | INTRAMUSCULAR | Status: DC | PRN
  Administered 2019-12-14 (×2): 25 ug via INTRAVENOUS

## 2019-12-14 MED ORDER — OXYCODONE-ACETAMINOPHEN 5-325 MG PO TABS
2.0000 | ORAL_TABLET | Freq: Once | ORAL | Status: AC
  Administered 2019-12-14: 2 via ORAL

## 2019-12-14 MED ORDER — EPHEDRINE SULFATE-NACL 50-0.9 MG/10ML-% IV SOSY
PREFILLED_SYRINGE | INTRAVENOUS | Status: DC | PRN
  Administered 2019-12-14: 30 mg via INTRAVENOUS
  Administered 2019-12-14: 20 mg via INTRAVENOUS

## 2019-12-14 MED ORDER — EPHEDRINE 5 MG/ML INJ
INTRAVENOUS | Status: AC
  Filled 2019-12-14: qty 10

## 2019-12-14 MED ORDER — PHENYLEPHRINE 40 MCG/ML (10ML) SYRINGE FOR IV PUSH (FOR BLOOD PRESSURE SUPPORT)
PREFILLED_SYRINGE | INTRAVENOUS | Status: DC | PRN
  Administered 2019-12-14 (×2): 200 ug via INTRAVENOUS

## 2019-12-14 MED ORDER — THROMBIN (RECOMBINANT) 20000 UNITS EX SOLR
CUTANEOUS | Status: AC
  Filled 2019-12-14: qty 20000

## 2019-12-14 MED ORDER — THROMBIN 20000 UNITS EX SOLR: CUTANEOUS | Status: DC | PRN

## 2019-12-14 MED ORDER — DEXAMETHASONE SODIUM PHOSPHATE 10 MG/ML IJ SOLN
INTRAMUSCULAR | Status: DC | PRN
  Administered 2019-12-14: 8 mg via INTRAVENOUS

## 2019-12-14 MED ORDER — PROMETHAZINE HCL 25 MG/ML IJ SOLN: 6.2500 mg | INTRAMUSCULAR | Status: DC | PRN

## 2019-12-14 MED ORDER — METHYLPREDNISOLONE ACETATE 40 MG/ML IJ SUSP
INTRAMUSCULAR | Status: AC
  Filled 2019-12-14: qty 1

## 2019-12-14 MED ORDER — KETAMINE HCL 50 MG/5ML IJ SOSY
PREFILLED_SYRINGE | INTRAMUSCULAR | Status: AC
  Filled 2019-12-14: qty 5

## 2019-12-14 MED ORDER — MIDAZOLAM HCL 2 MG/2ML IJ SOLN
INTRAMUSCULAR | Status: DC | PRN
  Administered 2019-12-14: 2 mg via INTRAVENOUS

## 2019-12-14 MED ORDER — LIDOCAINE 2% (20 MG/ML) 5 ML SYRINGE
INTRAMUSCULAR | Status: AC
  Filled 2019-12-14: qty 5

## 2019-12-14 MED ORDER — BUPIVACAINE HCL (PF) 0.25 % IJ SOLN
INTRAMUSCULAR | Status: DC | PRN
  Administered 2019-12-14: 30 mL

## 2019-12-14 MED ORDER — OXYCODONE-ACETAMINOPHEN 5-325 MG PO TABS
ORAL_TABLET | ORAL | Status: AC
  Filled 2019-12-14: qty 2

## 2019-12-14 MED ORDER — LIDOCAINE 2% (20 MG/ML) 5 ML SYRINGE
INTRAMUSCULAR | Status: DC | PRN
  Administered 2019-12-14: 60 mg via INTRAVENOUS

## 2019-12-14 MED ORDER — EPINEPHRINE PF 1 MG/ML IJ SOLN
INTRAMUSCULAR | Status: DC | PRN
  Administered 2019-12-14: 1 mg

## 2019-12-14 MED ORDER — FENTANYL CITRATE (PF) 100 MCG/2ML IJ SOLN
INTRAMUSCULAR | Status: AC
  Filled 2019-12-14: qty 2

## 2019-12-14 MED ORDER — OXYCODONE-ACETAMINOPHEN 10-325 MG PO TABS: 1.0000 | ORAL_TABLET | Freq: Four times a day (QID) | ORAL | 0 refills | Status: AC | PRN

## 2019-12-14 MED ORDER — PROPOFOL 10 MG/ML IV BOLUS
INTRAVENOUS | Status: DC | PRN
  Administered 2019-12-14: 100 mg via INTRAVENOUS

## 2019-12-14 MED ORDER — VASOPRESSIN 20 UNIT/ML IV SOLN
INTRAVENOUS | Status: DC | PRN
  Administered 2019-12-14: 1 [IU] via INTRAVENOUS

## 2019-12-14 MED ORDER — 0.9 % SODIUM CHLORIDE (POUR BTL) OPTIME
TOPICAL | Status: DC | PRN
  Administered 2019-12-14: 1000 mL

## 2019-12-14 MED ORDER — HEMOSTATIC AGENTS (NO CHARGE) OPTIME
TOPICAL | Status: DC | PRN
  Administered 2019-12-14: 1 via TOPICAL

## 2019-12-14 MED ORDER — METHYLPREDNISOLONE ACETATE 40 MG/ML INJ SUSP (RADIOLOG
INTRAMUSCULAR | Status: DC | PRN
  Administered 2019-12-14: 40 mg

## 2019-12-14 MED ORDER — PROPOFOL 10 MG/ML IV BOLUS
INTRAVENOUS | Status: AC
  Filled 2019-12-14: qty 40

## 2019-12-14 MED ORDER — FENTANYL CITRATE (PF) 250 MCG/5ML IJ SOLN
INTRAMUSCULAR | Status: AC
  Filled 2019-12-14: qty 5

## 2019-12-14 MED ORDER — BUPIVACAINE HCL (PF) 0.25 % IJ SOLN
INTRAMUSCULAR | Status: AC
  Filled 2019-12-14: qty 30

## 2019-12-14 MED ORDER — ONDANSETRON HCL 4 MG PO TABS: 4.0000 mg | ORAL_TABLET | Freq: Three times a day (TID) | ORAL | 0 refills | Status: DC | PRN

## 2019-12-14 MED ORDER — ACETAMINOPHEN 10 MG/ML IV SOLN
INTRAVENOUS | Status: AC
  Filled 2019-12-14: qty 100

## 2019-12-14 MED ORDER — MIDAZOLAM HCL 2 MG/2ML IJ SOLN
INTRAMUSCULAR | Status: AC
  Filled 2019-12-14: qty 2

## 2019-12-14 MED ORDER — ACETAMINOPHEN 10 MG/ML IV SOLN
INTRAVENOUS | Status: DC | PRN
  Administered 2019-12-14: 1000 mg via INTRAVENOUS

## 2019-12-14 MED ORDER — GLYCOPYRROLATE PF 0.2 MG/ML IJ SOSY
PREFILLED_SYRINGE | INTRAMUSCULAR | Status: AC
  Filled 2019-12-14: qty 2

## 2019-12-14 MED ORDER — EPINEPHRINE PF 1 MG/ML IJ SOLN
INTRAMUSCULAR | Status: AC
  Filled 2019-12-14: qty 1

## 2019-12-14 MED ORDER — ONDANSETRON HCL 4 MG/2ML IJ SOLN
INTRAMUSCULAR | Status: DC | PRN
  Administered 2019-12-14: 4 mg via INTRAVENOUS

## 2019-12-14 MED ORDER — LACTATED RINGERS IV SOLN: INTRAVENOUS | Status: DC | PRN

## 2019-12-14 MED ORDER — ALBUMIN HUMAN 5 % IV SOLN: INTRAVENOUS | Status: DC | PRN

## 2019-12-14 MED ORDER — ROCURONIUM BROMIDE 10 MG/ML (PF) SYRINGE
PREFILLED_SYRINGE | INTRAVENOUS | Status: AC
  Filled 2019-12-14: qty 10

## 2019-12-14 MED ORDER — ROCURONIUM BROMIDE 10 MG/ML (PF) SYRINGE
PREFILLED_SYRINGE | INTRAVENOUS | Status: DC | PRN
  Administered 2019-12-14: 20 mg via INTRAVENOUS
  Administered 2019-12-14: 60 mg via INTRAVENOUS

## 2019-12-14 MED ORDER — PHENYLEPHRINE HCL-NACL 10-0.9 MG/250ML-% IV SOLN
INTRAVENOUS | Status: DC | PRN
  Administered 2019-12-14: 25 ug/min via INTRAVENOUS

## 2019-12-14 MED ORDER — ONDANSETRON HCL 4 MG/2ML IJ SOLN
INTRAMUSCULAR | Status: AC
  Filled 2019-12-14: qty 2

## 2019-12-14 MED ORDER — VASOPRESSIN 20 UNIT/ML IV SOLN
INTRAVENOUS | Status: AC
  Filled 2019-12-14: qty 1

## 2019-12-14 SURGICAL SUPPLY — 64 items
AGENT HMST KT MTR STRL THRMB (HEMOSTASIS) ×1
BNDG GAUZE ELAST 4 BULKY (GAUZE/BANDAGES/DRESSINGS) ×3 IMPLANT
BUR EGG ELITE 4.0 (BURR) IMPLANT
BUR EGG ELITE 4.0MM (BURR)
BUR MATCHSTICK NEURO 3.0 LAGG (BURR) IMPLANT
CANISTER SUCT 3000ML PPV (MISCELLANEOUS) ×3 IMPLANT
CLOSURE STERI-STRIP 1/2X4 (GAUZE/BANDAGES/DRESSINGS) ×1
CLSR STERI-STRIP ANTIMIC 1/2X4 (GAUZE/BANDAGES/DRESSINGS) ×2 IMPLANT
CORD BIPOLAR FORCEPS 12FT (ELECTRODE) ×3 IMPLANT
COVER SURGICAL LIGHT HANDLE (MISCELLANEOUS) ×3 IMPLANT
COVER WAND RF STERILE (DRAPES) ×3 IMPLANT
DEPO-MEDROL 40MG IMPLANT
DRAIN CHANNEL 15F RND FF W/TCR (WOUND CARE) IMPLANT
DRAPE POUCH INSTRU U-SHP 10X18 (DRAPES) ×3 IMPLANT
DRAPE SURG 17X23 STRL (DRAPES) ×3 IMPLANT
DRAPE U-SHAPE 47X51 STRL (DRAPES) ×3 IMPLANT
DRSG OPSITE POSTOP 3X4 (GAUZE/BANDAGES/DRESSINGS) ×3 IMPLANT
DRSG OPSITE POSTOP 4X6 (GAUZE/BANDAGES/DRESSINGS) ×3 IMPLANT
DURAPREP 26ML APPLICATOR (WOUND CARE) ×3 IMPLANT
ELECT BLADE 4.0 EZ CLEAN MEGAD (MISCELLANEOUS)
ELECT CAUTERY BLADE 6.4 (BLADE) ×3 IMPLANT
ELECT PENCIL ROCKER SW 15FT (MISCELLANEOUS) ×3 IMPLANT
ELECT REM PT RETURN 9FT ADLT (ELECTROSURGICAL) ×3
ELECTRODE BLDE 4.0 EZ CLN MEGD (MISCELLANEOUS) IMPLANT
ELECTRODE REM PT RTRN 9FT ADLT (ELECTROSURGICAL) ×1 IMPLANT
EVACUATOR SILICONE 100CC (DRAIN) IMPLANT
GLOVE BIO SURGEON STRL SZ 6.5 (GLOVE) ×2 IMPLANT
GLOVE BIO SURGEONS STRL SZ 6.5 (GLOVE) ×1
GLOVE BIOGEL PI IND STRL 6.5 (GLOVE) ×1 IMPLANT
GLOVE BIOGEL PI IND STRL 8.5 (GLOVE) ×1 IMPLANT
GLOVE BIOGEL PI INDICATOR 6.5 (GLOVE) ×2
GLOVE BIOGEL PI INDICATOR 8.5 (GLOVE) ×2
GLOVE SS BIOGEL STRL SZ 8.5 (GLOVE) ×1 IMPLANT
GLOVE SUPERSENSE BIOGEL SZ 8.5 (GLOVE) ×2
GOWN STRL REUS W/ TWL LRG LVL3 (GOWN DISPOSABLE) ×2 IMPLANT
GOWN STRL REUS W/TWL 2XL LVL3 (GOWN DISPOSABLE) ×3 IMPLANT
GOWN STRL REUS W/TWL LRG LVL3 (GOWN DISPOSABLE) ×6
KIT BASIN OR (CUSTOM PROCEDURE TRAY) ×3 IMPLANT
KIT TURNOVER KIT B (KITS) ×3 IMPLANT
NDL SPNL 18GX3.5 QUINCKE PK (NEEDLE) ×2 IMPLANT
NEEDLE 22X1 1/2 (OR ONLY) (NEEDLE) ×3 IMPLANT
NEEDLE SPNL 18GX3.5 QUINCKE PK (NEEDLE) ×6 IMPLANT
NS IRRIG 1000ML POUR BTL (IV SOLUTION) ×3 IMPLANT
PACK LAMINECTOMY ORTHO (CUSTOM PROCEDURE TRAY) ×3 IMPLANT
PACK UNIVERSAL I (CUSTOM PROCEDURE TRAY) ×3 IMPLANT
PAD ARMBOARD 7.5X6 YLW CONV (MISCELLANEOUS) ×6 IMPLANT
PATTIES SURGICAL .5 X.5 (GAUZE/BANDAGES/DRESSINGS) ×3 IMPLANT
PATTIES SURGICAL .5 X1 (DISPOSABLE) ×3 IMPLANT
SPONGE SURGIFOAM ABS GEL 100 (HEMOSTASIS) ×2 IMPLANT
SURGIFLO W/THROMBIN 8M KIT (HEMOSTASIS) ×2 IMPLANT
SUT BONE WAX W31G (SUTURE) ×3 IMPLANT
SUT MON AB 3-0 SH 27 (SUTURE) ×3
SUT MON AB 3-0 SH27 (SUTURE) ×1 IMPLANT
SUT VIC AB 0 CT1 27 (SUTURE)
SUT VIC AB 0 CT1 27XBRD ANBCTR (SUTURE) IMPLANT
SUT VIC AB 1 CT1 18XCR BRD 8 (SUTURE) ×1 IMPLANT
SUT VIC AB 1 CT1 8-18 (SUTURE) ×3
SUT VIC AB 2-0 CT1 18 (SUTURE) ×3 IMPLANT
SYR BULB IRRIGATION 50ML (SYRINGE) ×3 IMPLANT
SYR CONTROL 10ML LL (SYRINGE) ×3 IMPLANT
TOWEL GREEN STERILE (TOWEL DISPOSABLE) ×3 IMPLANT
TOWEL GREEN STERILE FF (TOWEL DISPOSABLE) ×3 IMPLANT
WATER STERILE IRR 1000ML POUR (IV SOLUTION) ×3 IMPLANT
YANKAUER SUCT BULB TIP NO VENT (SUCTIONS) IMPLANT

## 2019-12-14 NOTE — Brief Op Note (Signed)
12/14/2019  9:33 AM  PATIENT:  Adrian Mclaughlin  64 y.o. male  PRE-OPERATIVE DIAGNOSIS:  L4-5 left herniated disc lateral  POST-OPERATIVE DIAGNOSIS:  L4-5 left herniated disc lateral  PROCEDURE:  Procedure(s) with comments: Far lateral discectomy LUMBAR FOUR- LUMBAR FIVE left (Left) - 3 hrs  SURGEON:  Surgeon(s) and Role:    Melina Schools, MD - Primary  PHYSICIAN ASSISTANT:   ASSISTANTS: Amanda Ward, PA   ANESTHESIA:   general  EBL:  100 mL   BLOOD ADMINISTERED:none  DRAINS: none   LOCAL MEDICATIONS USED:  MARCAINE   / depomedrol  SPECIMEN:  No Specimen  DISPOSITION OF SPECIMEN:  N/A  COUNTS:  YES  TOURNIQUET:  * No tourniquets in log *  DICTATION: .Dragon Dictation  PLAN OF CARE: Discharge to home after PACU  PATIENT DISPOSITION:  PACU - hemodynamically stable.

## 2019-12-14 NOTE — Transfer of Care (Signed)
Immediate Anesthesia Transfer of Care Note  Patient: Adrian Mclaughlin  Procedure(s) Performed: Far lateral discectomy LUMBAR FOUR- LUMBAR FIVE left (Left Spine Lumbar)  Patient Location: PACU  Anesthesia Type:General  Level of Consciousness: awake, alert  and oriented  Airway & Oxygen Therapy: Patient Spontanous Breathing and Patient connected to nasal cannula oxygen  Post-op Assessment: Report given to RN, Post -op Vital signs reviewed and stable and Patient moving all extremities X 4  Post vital signs: Reviewed and stable  Last Vitals:  Vitals Value Taken Time  BP 133/70 12/14/19 0948  Temp 36.4 C 12/14/19 0948  Pulse 78 12/14/19 0953  Resp 18 12/14/19 0953  SpO2 100 % 12/14/19 0953  Vitals shown include unvalidated device data.  Last Pain:  Vitals:   12/14/19 0610  TempSrc:   PainSc: 5       Patients Stated Pain Goal: 2 ([id number])  Complications: No apparent anesthesia complications

## 2019-12-14 NOTE — Anesthesia Postprocedure Evaluation (Signed)
Anesthesia Post Note  Patient: PRAVIN RUMMEL  Procedure(s) Performed: Far lateral discectomy LUMBAR FOUR- LUMBAR FIVE left (Left Spine Lumbar)     Patient location during evaluation: PACU Anesthesia Type: General Level of consciousness: awake and alert Pain management: pain level controlled Vital Signs Assessment: post-procedure vital signs reviewed and stable Respiratory status: spontaneous breathing, nonlabored ventilation, respiratory function stable and patient connected to nasal cannula oxygen Cardiovascular status: blood pressure returned to baseline and stable Postop Assessment: no apparent nausea or vomiting Anesthetic complications: no    Last Vitals:  Vitals:   12/14/19 1018 12/14/19 1034  BP: 112/66 119/69  Pulse: 74 75  Resp: 20 17  Temp:  36.5 C  SpO2: 98% 98%    Last Pain:  Vitals:   12/14/19 1034  TempSrc:   PainSc: 3                  Tiajuana Amass

## 2019-12-14 NOTE — Op Note (Signed)
Operative report  Preoperative diagnosis: Left foraminal L4-5 disc herniation with L4 radiculopathy  Postoperative diagnosis: Same  Operative procedure: Left L4-5 foraminotomy and discectomy.  Complications: None  First Assistant: Cleta Alberts, PA  Indications: Adrian Mclaughlin is a pleasant 64 year old gentleman presents with severe radicular left L4 pain.  Imaging studies demonstrated a foraminal disc herniation with marked compression of the exiting L4 nerve root.  As result of the failure of conservative treatment and the severity of his radicular pain we elected to move forward with surgery.  All appropriate risks benefits and alternatives were discussed with the patient and consent was obtained.  Operative report: Patient was brought to the operating room placed on the operating room table.  After successful induction of general anesthesia and endotracheal intubation teds SCDs were applied and he was turned prone onto the Wilson frame.  All bony prominences were well-padded and the back was prepped and draped in a standard fashion.  Timeout was taken to confirm patient procedure and all other important data.  Using fluoroscopic guidance identified the lateral border of the left L4 pars as well as the L4 and the L5 pedicles.  I marked out my incision site and infiltrated the area with quarter percent Marcaine with epinephrine.  A incision was made approximately 2 fingerbreadths lateral to the midline just over the lateral border of the pars.  I then advanced the dilating tubes of the Thompson retractor sequentially and then placed the final retractors.  I now had excellent visualization of the L4 pars and the facet complex.  I then placed a Penfield 4 on the lateral aspect of the L4 pars and took an intraoperative x-ray to confirm I was at the appropriate level.  Once this was confirmed I used the L4 to begin to gently dissect and release the facet capsule to allow me to resect the lateral portion of the  pars with my 2 and 3 mm Kerrison rongeurs.  Once this was done I was able to dissect through the ligamentum flavum/facet capsule and remove this to expose the posterior aspect of the disc herniation.  Once I was able to dissect bluntly with a Penfield 4 I was able to visualize the disc herniation.  It was significantly displaced and the L4 nerve root was noted to be cranially displaced towards the L4 pedicle.  Enfield 4 was used to completely exposed the disc herniation and then an annulotomy was performed with the blind end of the Penfield 4.  I then used my nerve hook to mobilize the disc herniation and remove it with the micropituitary rongeurs.  Multiple small pieces of disc material were removed.  Ultimately the amount of disc material moved was consistent with what was seen on the preoperative MRI.  Once all the disc material was removed I could then visualize the L4 nerve root and it was freely mobile and no longer compressed against the L4 pedicle.  I could sweep my nerve hook underneath the nerve medially along the disc space and inferiorly towards the L5 pedicle.  At this point I had removed all of the disc material and completely decompressed the area that was compromised to the disc herniation.  Hemostasis was obtained using bipolar cautery as well as FloSeal.  Irrigation was done and I confirmed hemostasis.  I did place 20 mg (1/2 cc) of Depo-Medrol over the nerve root to aid in postoperative analgesia.  A thrombin-soaked Gelfoam patty was placed over the foraminotomy site and then I remove the retractor.  Wound was closed in a layered fashion with interrupted #1 Vicryl suture, 2-0 Vicryl suture, and 3-0 Monocryl for the skin.  Steri-Strips and dry dressings were applied and the patient was ultimately extubated transfer the PACU without incident.  The end of the case all needle sponge counts were correct.  There were no adverse intraoperative events.

## 2019-12-14 NOTE — Discharge Instructions (Signed)

## 2019-12-14 NOTE — H&P (Signed)
Addendum H&P  There has been no change in his clinical exam since his last office visit of 12/08/2019.  He continues to have severe radicular left leg pain in the L4 dermatome secondary to an L4 foraminal disc herniation.  I have gone over the surgical procedure with him in great detail which will be a foraminal discectomy on the left side at L4-5.  All of his questions were encouraged and addressed.  We have again reviewed the risks and benefits of surgery.

## 2019-12-14 NOTE — Anesthesia Procedure Notes (Signed)
Procedure Name: Intubation Date/Time: 12/14/2019 7:41 AM Performed by: Leonor Liv, CRNA Pre-anesthesia Checklist: Patient identified, Emergency Drugs available, Suction available and Patient being monitored Patient Re-evaluated:Patient Re-evaluated prior to induction Oxygen Delivery Method: Circle System Utilized Preoxygenation: Pre-oxygenation with 100% oxygen Induction Type: IV induction Ventilation: Mask ventilation without difficulty Laryngoscope Size: Mac and 4 Grade View: Grade II Tube type: Oral Tube size: 7.5 mm Number of attempts: 1 Airway Equipment and Method: Stylet and Oral airway Placement Confirmation: ETT inserted through vocal cords under direct vision,  positive ETCO2 and breath sounds checked- equal and bilateral Secured at: 23 cm Tube secured with: Tape Dental Injury: Teeth and Oropharynx as per pre-operative assessment

## 2019-12-15 ENCOUNTER — Encounter: Payer: Self-pay | Admitting: *Deleted

## 2019-12-15 MED FILL — Thrombin (Recombinant) For Soln 20000 Unit: CUTANEOUS | Qty: 1 | Status: AC

## 2019-12-21 ENCOUNTER — Other Ambulatory Visit: Payer: Self-pay | Admitting: Psychiatry

## 2019-12-21 DIAGNOSIS — F411 Generalized anxiety disorder: Secondary | ICD-10-CM

## 2019-12-21 NOTE — Telephone Encounter (Signed)
Next apt 07/22

## 2020-01-13 ENCOUNTER — Ambulatory Visit (INDEPENDENT_AMBULATORY_CARE_PROVIDER_SITE_OTHER): Payer: 59 | Admitting: Psychiatry

## 2020-01-13 ENCOUNTER — Encounter: Payer: Self-pay | Admitting: Psychiatry

## 2020-01-13 ENCOUNTER — Other Ambulatory Visit: Payer: Self-pay

## 2020-01-13 VITALS — BP 129/67 | HR 80

## 2020-01-13 DIAGNOSIS — G4700 Insomnia, unspecified: Secondary | ICD-10-CM

## 2020-01-13 DIAGNOSIS — F331 Major depressive disorder, recurrent, moderate: Secondary | ICD-10-CM

## 2020-01-13 DIAGNOSIS — F411 Generalized anxiety disorder: Secondary | ICD-10-CM

## 2020-01-13 MED ORDER — ALPRAZOLAM 0.5 MG PO TABS: 0.5000 mg | ORAL_TABLET | Freq: Four times a day (QID) | ORAL | 0 refills | Status: DC

## 2020-01-13 NOTE — Progress Notes (Signed)
Adrian Mclaughlin [id number] [birth year] 64 y.o.  Subjective:   Patient ID:  Adrian Mclaughlin is a 63 y.o. (DOB August 21, 1956) male.  Chief Complaint:  Chief Complaint  Patient presents with  . Anxiety  . Panic Attack    HPI Adrian Mclaughlin presents to the office today for follow-up of depression, anxiety, and panic attacks. He had back surgery 5 weeks ago and is trying to taper off oxycodone. Was on 10 mg tabs since February and then this was reduced to 5 mg. Now on oxycodone 2.5 mg TID for almost a week with plan to taper off. He reports that he has been having possible withdrawal s/s. Has been physically limited at this time. Noticed increased anxiety and depression about 1.5 weeks ago with trying to taper oxycodone.   Has been feeling more anxious when it is time to take oxycodone. He reports that sometimes he takes oxycodone and gets relief and other times anxiety is unchanged. Having panic attacks 2-3 times daily. He reports that anxiety has been "up and down." Has had increased depression as well. He reports that his sleep depends on medications. Occ waking up in the middle of the night and feeling "twitchy." He reports some nights sleep is adequate. Appetite has been up and down. He reports that energy and motivation are lower. Not as interested in usual activities. He reports that concentration is adequate for "basic" and that creative thinking has been more challenging. He reports some vague passive death wishes. Denies SI.   Has been having some pain.   Has been taking Xanax every night recently.  Past Psychiatric Medication Trials: Paxil Zoloft Cymbalta Prozac Effexor Abilify Seroquel XR Lamictal Alprazolam  AIMS     Office Visit from 04/01/2019 in [facility]  AIMS Total Score  0       Review of Systems:  Review of Systems  Constitutional: Negative for diaphoresis.  Gastrointestinal: Negative.   Genitourinary:       Has had urinary retention.  Improved with Flomax  Musculoskeletal: Positive for back pain. Negative for gait problem.  Neurological: Negative for tremors.  Psychiatric/Behavioral:       Please refer to HPI    Medications: I have reviewed the patient's current medications.  Current Outpatient Medications  Medication Sig Dispense Refill  . ALPRAZolam (XANAX) 0.5 MG tablet Take 1 tablet (0.5 mg total) by mouth in the morning, at noon, in the evening, and at bedtime. 120 tablet 0  . oxyCODONE-acetaminophen (PERCOCET/ROXICET) 5-325 MG tablet Take 0.5 tablets by mouth in the morning, at noon, and at bedtime.     Marland Kitchen PARoxetine (PAXIL) 40 MG tablet Take 1 tablet (40 mg total) by mouth daily. 90 tablet 1  . QUEtiapine (SEROQUEL XR) 200 MG 24 hr tablet TAKE 1 TABLET BY MOUTH  DAILY AT 6 PM (Patient taking differently: Take 200 mg by mouth every evening. ) 90 tablet 2  . quinapril-hydrochlorothiazide (ACCURETIC) 20-25 MG tablet Take 1 tablet by mouth daily.     Marland Kitchen lamoTRIgine (LAMICTAL) 150 MG tablet Take 1 tablet (150 mg total) by mouth daily. 90 tablet 2  . ondansetron (ZOFRAN) 4 MG tablet Take 1 tablet (4 mg total) by mouth every 8 (eight) hours as needed for nausea or vomiting. (Patient not taking: Reported on 01/13/2020) 20 tablet 0  . tamsulosin (FLOMAX) 0.4 MG CAPS capsule Take 0.4 mg by mouth at bedtime.     No current facility-administered medications for this visit.    Medication Side  Effects: None  Allergies: No Known Allergies  Past Medical History:  Diagnosis Date  . Anxiety   . Depression   . History of kidney stones   . Hypertension   . Mucoid cyst of joint    left long finger    Family History  Problem Relation Age of Onset  . Schizophrenia Maternal Aunt   . Autism Son   . Anxiety disorder Son   . Panic disorder Son     Social History   Socioeconomic History  . Marital status: Married    Spouse name: Not on file  . Number of children: Not on file  . Years of education: Not on file  .  Highest education level: Not on file  Occupational History  . Not on file  Tobacco Use  . Smoking status: Never Smoker  . Smokeless tobacco: Never Used  Substance and Sexual Activity  . Alcohol use: Yes    Comment: 2-3 drinks a night  . Drug use: Never  . Sexual activity: Not on file  Other Topics Concern  . Not on file  Social History Narrative  . Not on file   Social Determinants of Health   Financial Resource Strain:   . Difficulty of Paying Living Expenses:   Food Insecurity:   . Worried About Charity fundraiser in the Last Year:   . Arboriculturist in the Last Year:   Transportation Needs:   . Film/video editor (Medical):   Marland Kitchen Lack of Transportation (Non-Medical):   Physical Activity:   . Days of Exercise per Week:   . Minutes of Exercise per Session:   Stress:   . Feeling of Stress :   Social Connections:   . Frequency of Communication with Friends and Family:   . Frequency of Social Gatherings with Friends and Family:   . Attends Religious Services:   . Active Member of Clubs or Organizations:   . Attends Archivist Meetings:   Marland Kitchen Marital Status:   Intimate Partner Violence:   . Fear of Current or Ex-Partner:   . Emotionally Abused:   Marland Kitchen Physically Abused:   . Sexually Abused:     Past Medical History, Surgical history, Social history, and Family history were reviewed and updated as appropriate.   Please see review of systems for further details on the patient's review from today.   Objective:   Physical Exam:  BP 129/67   Pulse 80   Physical Exam Constitutional:      General: He is not in acute distress. Musculoskeletal:        General: No deformity.  Neurological:     Mental Status: He is alert and oriented to person, place, and time.     Coordination: Coordination normal.  Psychiatric:        Attention and Perception: Attention and perception normal. He does not perceive auditory or visual hallucinations.        Mood and Affect:  Mood is anxious and depressed. Affect is not labile, blunt, angry or inappropriate.        Speech: Speech normal.        Behavior: Behavior normal.        Thought Content: Thought content normal. Thought content is not paranoid or delusional. Thought content does not include homicidal or suicidal ideation. Thought content does not include homicidal or suicidal plan.        Cognition and Memory: Cognition and memory normal.  Judgment: Judgment normal.     Comments: Insight intact     Lab Review:     Component Value Date/Time   NA 133 (L) 12/12/2019 1051   K 4.2 12/12/2019 1051   CL 95 (L) 12/12/2019 1051   CO2 29 12/12/2019 1051   GLUCOSE 103 (H) 12/12/2019 1051   BUN 22 12/12/2019 1051   CREATININE 1.26 (H) 12/12/2019 1051   CALCIUM 9.9 12/12/2019 1051   GFRNONAA 60 (L) 12/12/2019 1051   GFRAA >60 12/12/2019 1051       Component Value Date/Time   WBC 6.5 12/12/2019 1051   RBC 4.68 12/12/2019 1051   HGB 13.5 12/12/2019 1051   HCT 40.3 12/12/2019 1051   PLT 255 12/12/2019 1051   MCV 86.1 12/12/2019 1051   MCH 28.8 12/12/2019 1051   MCHC 33.5 12/12/2019 1051   RDW 12.4 12/12/2019 1051   LYMPHSABS 1.0 06/03/2007 1550   MONOABS 0.4 06/03/2007 1550   EOSABS 0.0 06/03/2007 1550   BASOSABS 0.0 06/03/2007 1550    No results found for: POCLITH, LITHIUM   No results found for: PHENYTOIN, PHENOBARB, VALPROATE, CBMZ   .res Assessment: Plan:   Case staffed with Dr. Clovis Pu. Discussed that taper off oxycodone seems to have triggered anxiety and depression. Discussed using scheduled Xanax short-term to treat acute anxiety and to help titrate off oxycodone over the next few days.  Will start Xanax 0.5 mg QID for anxiety for the next month. Will discuss taper at next visit.  Continue all other medications as prescribed.  Pt to f/u in 4 weeks or sooner if clinically indicated.  Patient advised to contact office with any questions, adverse effects, or acute worsening in signs and  symptoms.   Lacy was seen today for anxiety and panic attack.  Diagnoses and all orders for this visit:  Anxiety state Comments: Stable. Chronic Orders: -     ALPRAZolam (XANAX) 0.5 MG tablet; Take 1 tablet (0.5 mg total) by mouth in the morning, at noon, in the evening, and at bedtime.     Please see After Visit Summary for patient specific instructions.  Future Appointments  Date Time Provider [place]  04/05/2020  8:30 AM Thayer Headings, PMHNP [facility] None    No orders of the defined types were placed in this encounter.   -------------------------------

## 2020-02-23 ENCOUNTER — Encounter: Payer: Self-pay | Admitting: Psychiatry

## 2020-02-23 ENCOUNTER — Other Ambulatory Visit: Payer: Self-pay

## 2020-02-23 ENCOUNTER — Ambulatory Visit (INDEPENDENT_AMBULATORY_CARE_PROVIDER_SITE_OTHER): Payer: 59 | Admitting: Psychiatry

## 2020-02-23 DIAGNOSIS — G4700 Insomnia, unspecified: Secondary | ICD-10-CM | POA: Diagnosis not present

## 2020-02-23 DIAGNOSIS — F331 Major depressive disorder, recurrent, moderate: Secondary | ICD-10-CM

## 2020-02-23 DIAGNOSIS — F411 Generalized anxiety disorder: Secondary | ICD-10-CM

## 2020-02-23 MED ORDER — LAMOTRIGINE 200 MG PO TABS: 200.0000 mg | ORAL_TABLET | Freq: Every day | ORAL | 1 refills | Status: DC

## 2020-02-23 NOTE — Progress Notes (Signed)
Adrian Mclaughlin [id number] Oct 01, 1955 64 y.o.  Subjective:   Patient ID:  Adrian Mclaughlin is a 64 y.o. (DOB 01/19/1956) male.  Chief Complaint:  Chief Complaint  Patient presents with  . Depression  . Anxiety    HPI Adrian Mclaughlin presents to the office today for follow-up of depression and anxiety. He reports that he is depressed and that depression has been worse. Mood has been persistently sad and that mornings are worse and this is consistent with depression. He reports that anxiety seems to correlate with depression. Notices anxious thoughts. He reports some catastrophic thinking and rumination, such as "am I ever getting back to where I was?" Denies any panic s/s since coming off oxycodone. He reports falling asleep without difficulty with medications. He reports that he has "a lot of nightmares" and this has been long-standing. He reports that nightmares interfere with sleep quality. Appetite has been decreased and is not interested in food. He reports that he has gained some weight. Has been less physically active since surgery.  Motivation has been low. He reports that energy is ok except for after taking Xanax. 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 Denies SI.   He reports that he was able to come off Oxycodone. Taking one Xanax in the early afternoon and another before bedtime.   He reports that many of his favorite activities involve some use of his back- repelling, motorcycle riding, etc. Has started using his trike.   Drinks 3 beers a night.   Past Psychiatric Medication Trials: Paxil- Has taken 40 mg long-term. Zoloft Cymbalta Prozac Effexor Abilify Seroquel XR Lamictal Alprazolam  AIMS     Office Visit from 02/23/2020 in [place] Visit from 04/01/2019 in [place] Total Score 0 0    GAD-7     Office Visit from 02/23/2020 in [place]  Total GAD-7 Score 8     PHQ2-9     Office Visit from 02/23/2020 in [facility]  PHQ-2 Total Score 5  PHQ-9 Total Score 8       Review of Systems:  Review of Systems  Musculoskeletal: Negative for gait problem.       Recovering from back surgery.   Neurological: Negative for tremors.  Psychiatric/Behavioral:       Please refer to HPI   Reports that he is recovering from back surgery. Had several sessions of PT.   Medications: I have reviewed the patient's current medications.  Current Outpatient Medications  Medication Sig Dispense Refill  . PARoxetine (PAXIL) 40 MG tablet Take 1 tablet (40 mg total) by mouth daily. 90 tablet 1  . QUEtiapine (SEROQUEL XR) 200 MG 24 hr tablet TAKE 1 TABLET BY MOUTH  DAILY AT 6 PM (Patient taking differently: Take 200 mg by mouth every evening. ) 90 tablet 2  . quinapril-hydrochlorothiazide (ACCURETIC) 20-25 MG tablet Take 1 tablet by mouth daily.     . tamsulosin (FLOMAX) 0.4 MG CAPS capsule Take 0.4 mg by mouth at bedtime. Reports taking every 2-3 nights.    . ALPRAZolam (XANAX) 0.5 MG tablet Take 1 tablet (0.5 mg total) by mouth in the morning, at noon, in the evening, and at bedtime. (Patient taking differently: Take 0.5 mg by mouth 2 (two) times daily as needed. ) 120 tablet 0  . lamoTRIgine (LAMICTAL) 200 MG tablet Take 1 tablet (200 mg total) by mouth daily. 30 tablet 1  . ondansetron (ZOFRAN) 4 MG tablet  Take 1 tablet (4 mg total) by mouth every 8 (eight) hours as needed for nausea or vomiting. (Patient not taking: Reported on 01/13/2020) 20 tablet 0  . oxyCODONE-acetaminophen (PERCOCET/ROXICET) 5-325 MG tablet Take 0.5 tablets by mouth in the morning, at noon, and at bedtime.  (Patient not taking: Reported on 02/23/2020)     No current facility-administered medications for this visit.    Medication Side Effects: None  Allergies: No Known Allergies  Past Medical History:  Diagnosis Date  . Anxiety   . Depression   . History of kidney stones   .  Hypertension   . Mucoid cyst of joint    left long finger    Family History  Problem Relation Age of Onset  . Schizophrenia Maternal Aunt   . Autism Son   . Anxiety disorder Son   . Panic disorder Son     Social History   Socioeconomic History  . Marital status: Married    Spouse name: Not on file  . Number of children: Not on file  . Years of education: Not on file  . Highest education level: Not on file  Occupational History  . Not on file  Tobacco Use  . Smoking status: Never Smoker  . Smokeless tobacco: Never Used  Vaping Use  . Vaping Use: Never used  Substance and Sexual Activity  . Alcohol use: Yes    Comment: 2-3 drinks a night  . Drug use: Never  . Sexual activity: Not on file  Other Topics Concern  . Not on file  Social History Narrative  . Not on file   Social Determinants of Health   Financial Resource Strain:   . Difficulty of Paying Living Expenses:   Food Insecurity:   . Worried About Charity fundraiser in the Last Year:   . Arboriculturist in the Last Year:   Transportation Needs:   . Film/video editor (Medical):   Marland Kitchen Lack of Transportation (Non-Medical):   Physical Activity:   . Days of Exercise per Week:   . Minutes of Exercise per Session:   Stress:   . Feeling of Stress :   Social Connections:   . Frequency of Communication with Friends and Family:   . Frequency of Social Gatherings with Friends and Family:   . Attends Religious Services:   . Active Member of Clubs or Organizations:   . Attends Archivist Meetings:   Marland Kitchen Marital Status:   Intimate Partner Violence:   . Fear of Current or Ex-Partner:   . Emotionally Abused:   Marland Kitchen Physically Abused:   . Sexually Abused:     Past Medical History, Surgical history, Social history, and Family history were reviewed and updated as appropriate.   Please see review of systems for further details on the patient's review from today.   Objective:   Physical Exam:  There were  no vitals taken for this visit.  Physical Exam Constitutional:      General: He is not in acute distress. Musculoskeletal:        General: No deformity.  Neurological:     Mental Status: He is alert and oriented to person, place, and time.     Coordination: Coordination normal.  Psychiatric:        Attention and Perception: Attention and perception normal. He does not perceive auditory or visual hallucinations.        Mood and Affect: Mood is anxious and depressed. Affect is not labile,  blunt, angry or inappropriate.        Speech: Speech normal.        Behavior: Behavior normal.        Thought Content: Thought content normal. Thought content is not paranoid or delusional. Thought content does not include homicidal or suicidal ideation. Thought content does not include homicidal or suicidal plan.        Cognition and Memory: Cognition and memory normal.        Judgment: Judgment normal.     Comments: Insight intact Less anxious compared to last visit     Lab Review:     Component Value Date/Time   NA 133 (L) 12/12/2019 1051   K 4.2 12/12/2019 1051   CL 95 (L) 12/12/2019 1051   CO2 29 12/12/2019 1051   GLUCOSE 103 (H) 12/12/2019 1051   BUN 22 12/12/2019 1051   CREATININE 1.26 (H) 12/12/2019 1051   CALCIUM 9.9 12/12/2019 1051   GFRNONAA 60 (L) 12/12/2019 1051   GFRAA >60 12/12/2019 1051       Component Value Date/Time   WBC 6.5 12/12/2019 1051   RBC 4.68 12/12/2019 1051   HGB 13.5 12/12/2019 1051   HCT 40.3 12/12/2019 1051   PLT 255 12/12/2019 1051   MCV 86.1 12/12/2019 1051   MCH 28.8 12/12/2019 1051   MCHC 33.5 12/12/2019 1051   RDW 12.4 12/12/2019 1051   LYMPHSABS 1.0 06/03/2007 1550   MONOABS 0.4 06/03/2007 1550   EOSABS 0.0 06/03/2007 1550   BASOSABS 0.0 06/03/2007 1550    No results found for: POCLITH, LITHIUM   No results found for: PHENYTOIN, PHENOBARB, VALPROATE, CBMZ   .res Assessment: Plan:   Pt seen for 30 minutes and time spent counseling pt re:  treatment options. Discussed potential benefits, risks, and side effects of increasing Lamictal to improve mood and anxiety. Will increase Lamictal to 200 mg po qd to improve depression and anxiety.  Continue to taper Xanax. Continue Paxil 40 mg po qd for anxiety and depression. Continue Seroquel XR 200 mg po qd for depression, anxiety, and insomnia. Pt to f/u in 4 weeks or sooner if clinically indicated.  Patient advised to contact office with any questions, adverse effects, or acute worsening in signs and symptoms.  Avonte was seen today for depression and anxiety.  Diagnoses and all orders for this visit:  Moderate episode of recurrent major depressive disorder (HCC) -     lamoTRIgine (LAMICTAL) 200 MG tablet; Take 1 tablet (200 mg total) by mouth daily.  Anxiety state  Insomnia, unspecified type     Please see After Visit Summary for patient specific instructions.  Future Appointments  Date Time Provider [place]  03/30/2020  9:30 AM Thayer Headings, PMHNP [facility] None    No orders of the defined types were placed in this encounter.   -------------------------------

## 2020-02-23 NOTE — Progress Notes (Signed)
   02/23/20 1513  Facial and Oral Movements  Muscles of Facial Expression 0  Lips and Perioral Area 0  Jaw 0  Tongue 0  Extremity Movements  Upper (arms, wrists, hands, fingers) 0  Lower (legs, knees, ankles, toes) 0  Trunk Movements  Neck, shoulders, hips 0  Overall Severity  Severity of abnormal movements (highest score from questions above) 0  Incapacitation due to abnormal movements 0  Patient's awareness of abnormal movements (rate only patient's report) 0  AIMS Total Score  AIMS Total Score 0

## 2020-03-19 ENCOUNTER — Other Ambulatory Visit: Payer: Self-pay | Admitting: Psychiatry

## 2020-03-19 DIAGNOSIS — F411 Generalized anxiety disorder: Secondary | ICD-10-CM

## 2020-03-30 ENCOUNTER — Encounter: Payer: Self-pay | Admitting: Psychiatry

## 2020-03-30 ENCOUNTER — Encounter (INDEPENDENT_AMBULATORY_CARE_PROVIDER_SITE_OTHER): Payer: Self-pay

## 2020-03-30 ENCOUNTER — Ambulatory Visit: Payer: 59 | Admitting: Psychiatry

## 2020-03-30 ENCOUNTER — Ambulatory Visit (INDEPENDENT_AMBULATORY_CARE_PROVIDER_SITE_OTHER): Payer: 59 | Admitting: Psychiatry

## 2020-03-30 ENCOUNTER — Other Ambulatory Visit: Payer: Self-pay

## 2020-03-30 DIAGNOSIS — F3342 Major depressive disorder, recurrent, in full remission: Secondary | ICD-10-CM | POA: Diagnosis not present

## 2020-03-30 DIAGNOSIS — F331 Major depressive disorder, recurrent, moderate: Secondary | ICD-10-CM | POA: Diagnosis not present

## 2020-03-30 DIAGNOSIS — F411 Generalized anxiety disorder: Secondary | ICD-10-CM | POA: Diagnosis not present

## 2020-03-30 MED ORDER — QUETIAPINE FUMARATE ER 300 MG PO TB24: 300.0000 mg | ORAL_TABLET | Freq: Every evening | ORAL | 1 refills | Status: DC

## 2020-03-30 MED ORDER — PAROXETINE HCL 40 MG PO TABS: 60.0000 mg | ORAL_TABLET | Freq: Every day | ORAL | 1 refills | Status: DC

## 2020-03-30 MED ORDER — ALPRAZOLAM 0.5 MG PO TABS: 0.5000 mg | ORAL_TABLET | Freq: Two times a day (BID) | ORAL | 1 refills | Status: DC | PRN

## 2020-03-30 MED ORDER — LAMOTRIGINE 150 MG PO TABS: 150.0000 mg | ORAL_TABLET | Freq: Every day | ORAL | 1 refills | Status: DC

## 2020-03-30 NOTE — Progress Notes (Signed)
Adrian Mclaughlin [id number] [birth year] 64 y.o.  Subjective:   Patient ID:  Adrian Mclaughlin is a 64 y.o. (DOB 1955/11/29) male.  Chief Complaint:  Chief Complaint  Patient presents with  . Depression  . Anxiety    HPI Adrian Mclaughlin presents to the office today for follow-up of depression and anxiety. He reports that mood and anxiety s/s continue to gradually worsen. He reports that his motivation and energy remains low. "I wake up in the morning and I really don't want to do anything or eat anything." He reports that he has been having increased anxiety with stomach upset. Denies panic attacks. He reports that he has some anticipatory anxiety about different things that is unrelieved with cognitive reframing. Frequent worry and rumination. He reports that he is worried about "not having anything to do." He reports that weekends are the most difficult for him since he tends to not have as much to do.   He reports that depression triggers anxiety and vice versa. Mood has been persistently depressed. Denies irritability. Has not been sleeping well. Falls asleep without difficulty. Sleeping 10:30 pm-7 am. Appetite has been low. Less interested in things. Concentration has been ok. Anhedonia. Denies SI.   He reports that he will periodically become significantly more interested in something and have a desire to order things. Mood will periodically improve for about a week. Denies elevated mood. Denies any bursts of energy.   Reports that he is taking Xanax prn in the evening. Occasionally will take Xanax prn for severe anxiety.   Past Psychiatric Medication Trials: Paxil- Has taken 40 mg long-term. Zoloft Cymbalta Prozac Effexor- helped Remeron Abilify-Akathisia Seroquel XR Lamictal Alprazolam Lunesta   AIMS     Office Visit from 02/23/2020 in [place] Office Visit from 04/01/2019 in [place] Total Score 0 0    GAD-7     Office Visit  from 02/23/2020 in [place]  Total GAD-7 Score 8    PHQ2-9     Office Visit from 02/23/2020 in [facility]  PHQ-2 Total Score 5  PHQ-9 Total Score 8       Review of Systems:  Review of Systems  Musculoskeletal: Negative for gait problem.       Improved back pain  Neurological: Negative for tremors.  Psychiatric/Behavioral:       Please refer to HPI    Has been having increased night sweats. Recent dry mouth. Was having decreased BP in the mornings.   Medications: I have reviewed the patient's current medications.  Current Outpatient Medications  Medication Sig Dispense Refill  . [START ON 04/17/2020] ALPRAZolam (XANAX) 0.5 MG tablet Take 1 tablet (0.5 mg total) by mouth 2 (two) times daily as needed. 60 tablet 1  . PARoxetine (PAXIL) 40 MG tablet Take 1.5 tablets (60 mg total) by mouth daily. 90 tablet 1  . QUEtiapine (SEROQUEL XR) 300 MG 24 hr tablet Take 1 tablet (300 mg total) by mouth every evening. TAKE 1 TABLET BY MOUTH  DAILY AT 6 PM 30 tablet 1  . quinapril-hydrochlorothiazide (ACCURETIC) 20-25 MG tablet Take 1 tablet by mouth daily.     . tamsulosin (FLOMAX) 0.4 MG CAPS capsule Take 0.4 mg by mouth at bedtime. Reports taking every 2-3 nights.    . lamoTRIgine (LAMICTAL) 150 MG tablet Take 1 tablet (150 mg total) by mouth daily. 30 tablet 1  . ondansetron (ZOFRAN) 4 MG tablet Take 1 tablet (4 mg total) by  mouth every 8 (eight) hours as needed for nausea or vomiting. (Patient not taking: Reported on 01/13/2020) 20 tablet 0  . oxyCODONE-acetaminophen (PERCOCET/ROXICET) 5-325 MG tablet Take 0.5 tablets by mouth in the morning, at noon, and at bedtime.  (Patient not taking: Reported on 02/23/2020)     No current facility-administered medications for this visit.    Medication Side Effects: Other: Dry mouth  Allergies: No Known Allergies  Past Medical History:  Diagnosis Date  . Anxiety   . Depression   . History of kidney stones   .  Hypertension   . Mucoid cyst of joint    left long finger    Family History  Problem Relation Age of Onset  . Schizophrenia Maternal Aunt   . Autism Son   . Anxiety disorder Son   . Panic disorder Son     Social History   Socioeconomic History  . Marital status: Married    Spouse name: Not on file  . Number of children: Not on file  . Years of education: Not on file  . Highest education level: Not on file  Occupational History  . Not on file  Tobacco Use  . Smoking status: Never Smoker  . Smokeless tobacco: Never Used  Vaping Use  . Vaping Use: Never used  Substance and Sexual Activity  . Alcohol use: Yes    Comment: 2-3 drinks a night  . Drug use: Never  . Sexual activity: Not on file  Other Topics Concern  . Not on file  Social History Narrative  . Not on file   Social Determinants of Health   Financial Resource Strain:   . Difficulty of Paying Living Expenses:   Food Insecurity:   . Worried About Charity fundraiser in the Last Year:   . Arboriculturist in the Last Year:   Transportation Needs:   . Film/video editor (Medical):   Marland Kitchen Lack of Transportation (Non-Medical):   Physical Activity:   . Days of Exercise per Week:   . Minutes of Exercise per Session:   Stress:   . Feeling of Stress :   Social Connections:   . Frequency of Communication with Friends and Family:   . Frequency of Social Gatherings with Friends and Family:   . Attends Religious Services:   . Active Member of Clubs or Organizations:   . Attends Archivist Meetings:   Marland Kitchen Marital Status:   Intimate Partner Violence:   . Fear of Current or Ex-Partner:   . Emotionally Abused:   Marland Kitchen Physically Abused:   . Sexually Abused:     Past Medical History, Surgical history, Social history, and Family history were reviewed and updated as appropriate.   Please see review of systems for further details on the patient's review from today.   Objective:   Physical Exam:  There were  no vitals taken for this visit.  Physical Exam Constitutional:      General: He is not in acute distress. Musculoskeletal:        General: No deformity.  Neurological:     Mental Status: He is alert and oriented to person, place, and time.     Coordination: Coordination normal.  Psychiatric:        Attention and Perception: Attention and perception normal. He does not perceive auditory or visual hallucinations.        Mood and Affect: Mood is anxious and depressed. Affect is not labile, blunt, angry or inappropriate.  Speech: Speech normal.        Behavior: Behavior normal.        Thought Content: Thought content normal. Thought content is not paranoid or delusional. Thought content does not include homicidal or suicidal ideation. Thought content does not include homicidal or suicidal plan.        Cognition and Memory: Cognition and memory normal.        Judgment: Judgment normal.     Comments: Insight intact     Lab Review:     Component Value Date/Time   NA 133 (L) 12/12/2019 1051   K 4.2 12/12/2019 1051   CL 95 (L) 12/12/2019 1051   CO2 29 12/12/2019 1051   GLUCOSE 103 (H) 12/12/2019 1051   BUN 22 12/12/2019 1051   CREATININE 1.26 (H) 12/12/2019 1051   CALCIUM 9.9 12/12/2019 1051   GFRNONAA 60 (L) 12/12/2019 1051   GFRAA >60 12/12/2019 1051       Component Value Date/Time   WBC 6.5 12/12/2019 1051   RBC 4.68 12/12/2019 1051   HGB 13.5 12/12/2019 1051   HCT 40.3 12/12/2019 1051   PLT 255 12/12/2019 1051   MCV 86.1 12/12/2019 1051   MCH 28.8 12/12/2019 1051   MCHC 33.5 12/12/2019 1051   RDW 12.4 12/12/2019 1051   LYMPHSABS 1.0 06/03/2007 1550   MONOABS 0.4 06/03/2007 1550   EOSABS 0.0 06/03/2007 1550   BASOSABS 0.0 06/03/2007 1550    No results found for: POCLITH, LITHIUM   No results found for: PHENYTOIN, PHENOBARB, VALPROATE, CBMZ   .res Assessment: Plan:   Pt seen for 30 minutes and time spent discussing treatment options with pt and reviewing  record from past psychiatric providers. Discussed potential benefits, risks, and side effects of increasing Seroquel XR to 300 mg po q evening and increasing Paxil to 60 mg po qd for mood and anxiety s/s. Discussed that typically Seroquel doses of at least 300 mg po q evening are needed to improve mood s/s. Discussed considering changing Paxil to a different anti-depressant if there is no significant improvement in mood and anxiety s/s at max dose.  Will decrease Lamictal from 200 mg to 150 mg po qd since there was no improvement in mood with higher dose. Continue Xanax prn anxiety and insomnia.  Pt to f/u in 4 weeks or sooner if clinically indicated.  Patient advised to contact office with any questions, adverse effects, or acute worsening in signs and symptoms.    Adrian Mclaughlin was seen today for depression and anxiety.  Diagnoses and all orders for this visit:  Major depressive disorder, recurrent episode, in full remission ([place]) Comments: Chronic, stable Orders: -     QUEtiapine (SEROQUEL XR) 300 MG 24 hr tablet; Take 1 tablet (300 mg total) by mouth every evening. TAKE 1 TABLET BY MOUTH  DAILY AT 6 PM -     PARoxetine (PAXIL) 40 MG tablet; Take 1.5 tablets (60 mg total) by mouth daily.  Anxiety state Comments: Stable. Chronic Orders: -     PARoxetine (PAXIL) 40 MG tablet; Take 1.5 tablets (60 mg total) by mouth daily. -     ALPRAZolam (XANAX) 0.5 MG tablet; Take 1 tablet (0.5 mg total) by mouth 2 (two) times daily as needed.  Moderate episode of recurrent major depressive disorder (HCC) -     lamoTRIgine (LAMICTAL) 150 MG tablet; Take 1 tablet (150 mg total) by mouth daily.     Please see After Visit Summary for patient specific instructions.  Future  Appointments  Date Time Provider [place]  04/27/2020  9:30 AM Thayer Headings, PMHNP [facility] None    No orders of the defined types were placed in this encounter.   -------------------------------

## 2020-04-05 ENCOUNTER — Ambulatory Visit: Payer: 59 | Admitting: Psychiatry

## 2020-04-07 ENCOUNTER — Other Ambulatory Visit: Payer: Self-pay | Admitting: Psychiatry

## 2020-04-07 DIAGNOSIS — F411 Generalized anxiety disorder: Secondary | ICD-10-CM

## 2020-04-07 DIAGNOSIS — F3342 Major depressive disorder, recurrent, in full remission: Secondary | ICD-10-CM

## 2020-04-18 ENCOUNTER — Ambulatory Visit: Payer: 59 | Admitting: Psychiatry

## 2020-04-23 ENCOUNTER — Other Ambulatory Visit: Payer: Self-pay | Admitting: Psychiatry

## 2020-04-23 DIAGNOSIS — F3342 Major depressive disorder, recurrent, in full remission: Secondary | ICD-10-CM

## 2020-04-23 DIAGNOSIS — F411 Generalized anxiety disorder: Secondary | ICD-10-CM

## 2020-04-27 ENCOUNTER — Ambulatory Visit: Payer: 59 | Admitting: Psychiatry

## 2020-05-08 ENCOUNTER — Ambulatory Visit (INDEPENDENT_AMBULATORY_CARE_PROVIDER_SITE_OTHER): Payer: 59 | Admitting: Psychiatry

## 2020-05-08 ENCOUNTER — Encounter: Payer: Self-pay | Admitting: Psychiatry

## 2020-05-08 ENCOUNTER — Other Ambulatory Visit: Payer: Self-pay

## 2020-05-08 DIAGNOSIS — F411 Generalized anxiety disorder: Secondary | ICD-10-CM | POA: Diagnosis not present

## 2020-05-08 DIAGNOSIS — F3342 Major depressive disorder, recurrent, in full remission: Secondary | ICD-10-CM

## 2020-05-08 MED ORDER — PAROXETINE HCL 40 MG PO TABS: 60.0000 mg | ORAL_TABLET | Freq: Every day | ORAL | 1 refills | Status: DC

## 2020-05-08 MED ORDER — LAMOTRIGINE 150 MG PO TABS: 150.0000 mg | ORAL_TABLET | Freq: Every day | ORAL | 1 refills | Status: DC

## 2020-05-08 MED ORDER — QUETIAPINE FUMARATE ER 300 MG PO TB24: 300.0000 mg | ORAL_TABLET | Freq: Every evening | ORAL | 1 refills | Status: DC

## 2020-05-08 NOTE — Progress Notes (Signed)
°   05/08/20 1041  Facial and Oral Movements  Muscles of Facial Expression 0  Lips and Perioral Area 0  Jaw 0  Tongue 0  Extremity Movements  Upper (arms, wrists, hands, fingers) 0  Lower (legs, knees, ankles, toes) 0  Trunk Movements  Neck, shoulders, hips 0  Overall Severity  Severity of abnormal movements (highest score from questions above) 0  Incapacitation due to abnormal movements 0  Patient's awareness of abnormal movements (rate only patient's report) 0  AIMS Total Score  AIMS Total Score 0

## 2020-05-08 NOTE — Progress Notes (Signed)
Adrian Mclaughlin [id number] 1956/08/12 64 y.o.  Subjective:   Patient ID:  Adrian Mclaughlin is a 64 y.o. (DOB 03/19/1956) male.  Chief Complaint:  Chief Complaint  Patient presents with  . Follow-up    h/o depression, anxiety, and insomnia    HPI Adrian Mclaughlin presents to the office today for follow-up of depression and anxiety.  He reports that his depression has resolved. He reports that he feels better when he is more active. Biking 10 miles about 5 times a week. He reports that his anxiety has improved. Denies any current anxiety other than anxiety about retirement and thinking about when and where to retire. He reports adequate sleep. He reports that he notices excessive daytime somnolence if he takes Seroquel XR later in the evening. Sleeping 10:30- 7 am. He reports that appetite has been good and wt is stable. Energy and motivation have been good. Concentration is adequate. Denies SI.   Continues to work remotely and reports that company has postponed return to office date.   Seldom taking Xanax prn.  Past Psychiatric Medication Trials: Paxil- Has taken 40 mg long-term. Zoloft Cymbalta Prozac Effexor- helped Remeron Abilify-Akathisia Seroquel XR Lamictal Alprazolam Lunesta  AIMS     Office Visit from 05/08/2020 in [place] Office Visit from 02/23/2020 in [facility] Visit from 04/01/2019 in [place] Total Score 0 0 0    GAD-7     Office Visit from 02/23/2020 in [facility]  Total GAD-7 Score 8    PHQ2-9     Office Visit from 02/23/2020 in [facility]  PHQ-2 Total Score 5  PHQ-9 Total Score 8       Review of Systems:  Review of Systems  Musculoskeletal: Negative for gait problem.  Neurological: Positive for dizziness. Negative for tremors.  Psychiatric/Behavioral:       Please refer to HPI    Medications: I have reviewed the patient's current  medications.  Current Outpatient Medications  Medication Sig Dispense Refill  . ALPRAZolam (XANAX) 0.5 MG tablet Take 1 tablet (0.5 mg total) by mouth 2 (two) times daily as needed. 60 tablet 1  . PARoxetine (PAXIL) 40 MG tablet Take 1.5 tablets (60 mg total) by mouth daily. 135 tablet 1  . tamsulosin (FLOMAX) 0.4 MG CAPS capsule Take 0.4 mg by mouth at bedtime. Reports taking every 2-3 nights.    . lamoTRIgine (LAMICTAL) 150 MG tablet Take 1 tablet (150 mg total) by mouth daily. 90 tablet 1  . QUEtiapine (SEROQUEL XR) 300 MG 24 hr tablet Take 1 tablet (300 mg total) by mouth every evening. TAKE 1 TABLET BY MOUTH  DAILY AT 6 PM 90 tablet 1  . quinapril-hydrochlorothiazide (ACCURETIC) 20-25 MG tablet Take 1 tablet by mouth daily.  (Patient not taking: Reported on 05/08/2020)     No current facility-administered medications for this visit.    Medication Side Effects: Other: Some orthostasis  Allergies: No Known Allergies  Past Medical History:  Diagnosis Date  . Anxiety   . Depression   . History of kidney stones   . Hypertension   . Mucoid cyst of joint    left long finger    Family History  Problem Relation Age of Onset  . Schizophrenia Maternal Aunt   . Autism Son   . Anxiety disorder Son   . Panic disorder Son     Social History   Socioeconomic History  . Marital status: Married  Spouse name: Not on file  . Number of children: Not on file  . Years of education: Not on file  . Highest education level: Not on file  Occupational History  . Not on file  Tobacco Use  . Smoking status: Never Smoker  . Smokeless tobacco: Never Used  Vaping Use  . Vaping Use: Never used  Substance and Sexual Activity  . Alcohol use: Yes    Comment: 2-3 drinks a night  . Drug use: Never  . Sexual activity: Not on file  Other Topics Concern  . Not on file  Social History Narrative  . Not on file   Social Determinants of Health   Financial Resource Strain:   . Difficulty of  Paying Living Expenses: Not on file  Food Insecurity:   . Worried About Charity fundraiser in the Last Year: Not on file  . Ran Out of Food in the Last Year: Not on file  Transportation Needs:   . Lack of Transportation (Medical): Not on file  . Lack of Transportation (Non-Medical): Not on file  Physical Activity:   . Days of Exercise per Week: Not on file  . Minutes of Exercise per Session: Not on file  Stress:   . Feeling of Stress : Not on file  Social Connections:   . Frequency of Communication with Friends and Family: Not on file  . Frequency of Social Gatherings with Friends and Family: Not on file  . Attends Religious Services: Not on file  . Active Member of Clubs or Organizations: Not on file  . Attends Archivist Meetings: Not on file  . Marital Status: Not on file  Intimate Partner Violence:   . Fear of Current or Ex-Partner: Not on file  . Emotionally Abused: Not on file  . Physically Abused: Not on file  . Sexually Abused: Not on file    Past Medical History, Surgical history, Social history, and Family history were reviewed and updated as appropriate.   Please see review of systems for further details on the patient's review from today.   Objective:   Physical Exam:  BP 124/84   Pulse 66   Physical Exam Constitutional:      General: He is not in acute distress. Musculoskeletal:        General: No deformity.  Neurological:     Mental Status: He is alert and oriented to person, place, and time.     Coordination: Coordination normal.  Psychiatric:        Attention and Perception: Attention and perception normal. He does not perceive auditory or visual hallucinations.        Mood and Affect: Mood normal. Mood is not anxious or depressed. Affect is not labile, blunt, angry or inappropriate.        Speech: Speech normal.        Behavior: Behavior normal.        Thought Content: Thought content normal. Thought content is not paranoid or delusional.  Thought content does not include homicidal or suicidal ideation. Thought content does not include homicidal or suicidal plan.        Cognition and Memory: Cognition and memory normal.        Judgment: Judgment normal.     Comments: Insight intact     Lab Review:     Component Value Date/Time   NA 133 (L) 12/12/2019 1051   K 4.2 12/12/2019 1051   CL 95 (L) 12/12/2019 1051   CO2  29 12/12/2019 1051   GLUCOSE 103 (H) 12/12/2019 1051   BUN 22 12/12/2019 1051   CREATININE 1.26 (H) 12/12/2019 1051   CALCIUM 9.9 12/12/2019 1051   GFRNONAA 60 (L) 12/12/2019 1051   GFRAA >60 12/12/2019 1051       Component Value Date/Time   WBC 6.5 12/12/2019 1051   RBC 4.68 12/12/2019 1051   HGB 13.5 12/12/2019 1051   HCT 40.3 12/12/2019 1051   PLT 255 12/12/2019 1051   MCV 86.1 12/12/2019 1051   MCH 28.8 12/12/2019 1051   MCHC 33.5 12/12/2019 1051   RDW 12.4 12/12/2019 1051   LYMPHSABS 1.0 06/03/2007 1550   MONOABS 0.4 06/03/2007 1550   EOSABS 0.0 06/03/2007 1550   BASOSABS 0.0 06/03/2007 1550    No results found for: POCLITH, LITHIUM   No results found for: PHENYTOIN, PHENOBARB, VALPROATE, CBMZ   .res Assessment: Plan:   Will continue current plan of care since target signs and symptoms are well controlled without any tolerability issues.  Peggy was seen today for follow-up.  Diagnoses and all orders for this visit:  Major depressive disorder, recurrent episode, in full remission ([place]) -     QUEtiapine (SEROQUEL XR) 300 MG 24 hr tablet; Take 1 tablet (300 mg total) by mouth every evening. TAKE 1 TABLET BY MOUTH  DAILY AT 6 PM -     PARoxetine (PAXIL) 40 MG tablet; Take 1.5 tablets (60 mg total) by mouth daily. -     lamoTRIgine (LAMICTAL) 150 MG tablet; Take 1 tablet (150 mg total) by mouth daily.  Anxiety state -     PARoxetine (PAXIL) 40 MG tablet; Take 1.5 tablets (60 mg total) by mouth daily.     Please see After Visit Summary for patient specific instructions.  Future  Appointments  Date Time Provider [place]  11/08/2020  8:30 AM Thayer Headings, PMHNP [facility] None    No orders of the defined types were placed in this encounter.   -------------------------------

## 2020-05-28 ENCOUNTER — Other Ambulatory Visit: Payer: Self-pay | Admitting: Psychiatry

## 2020-05-28 DIAGNOSIS — F3342 Major depressive disorder, recurrent, in full remission: Secondary | ICD-10-CM

## 2020-05-28 NOTE — Telephone Encounter (Signed)
Review.

## 2020-09-25 ENCOUNTER — Other Ambulatory Visit: Payer: Self-pay | Admitting: Psychiatry

## 2020-09-25 DIAGNOSIS — F411 Generalized anxiety disorder: Secondary | ICD-10-CM

## 2020-09-25 DIAGNOSIS — F3342 Major depressive disorder, recurrent, in full remission: Secondary | ICD-10-CM

## 2020-10-17 ENCOUNTER — Other Ambulatory Visit: Payer: Self-pay | Admitting: Psychiatry

## 2020-10-17 DIAGNOSIS — F3342 Major depressive disorder, recurrent, in full remission: Secondary | ICD-10-CM

## 2020-11-01 IMAGING — RF DG LUMBAR SPINE 2-3V
1 series · 3 of 3 positions shown · non-contrast
Comparison: None

CLINICAL DATA: L4-L5 discectomy

EXAM:
DG C-ARM 1-60 MIN; LUMBAR SPINE - 2-3 VIEW
FLUOROSCOPY TIME:  Fluoroscopy Time:  0 minutes 21 seconds
Radiation Exposure Index (if provided by the fluoroscopic device):
Not provided
Number of Acquired Spot Images: 3

[Series 1: run · 3 of 3 slices shown]
[im 1/3]
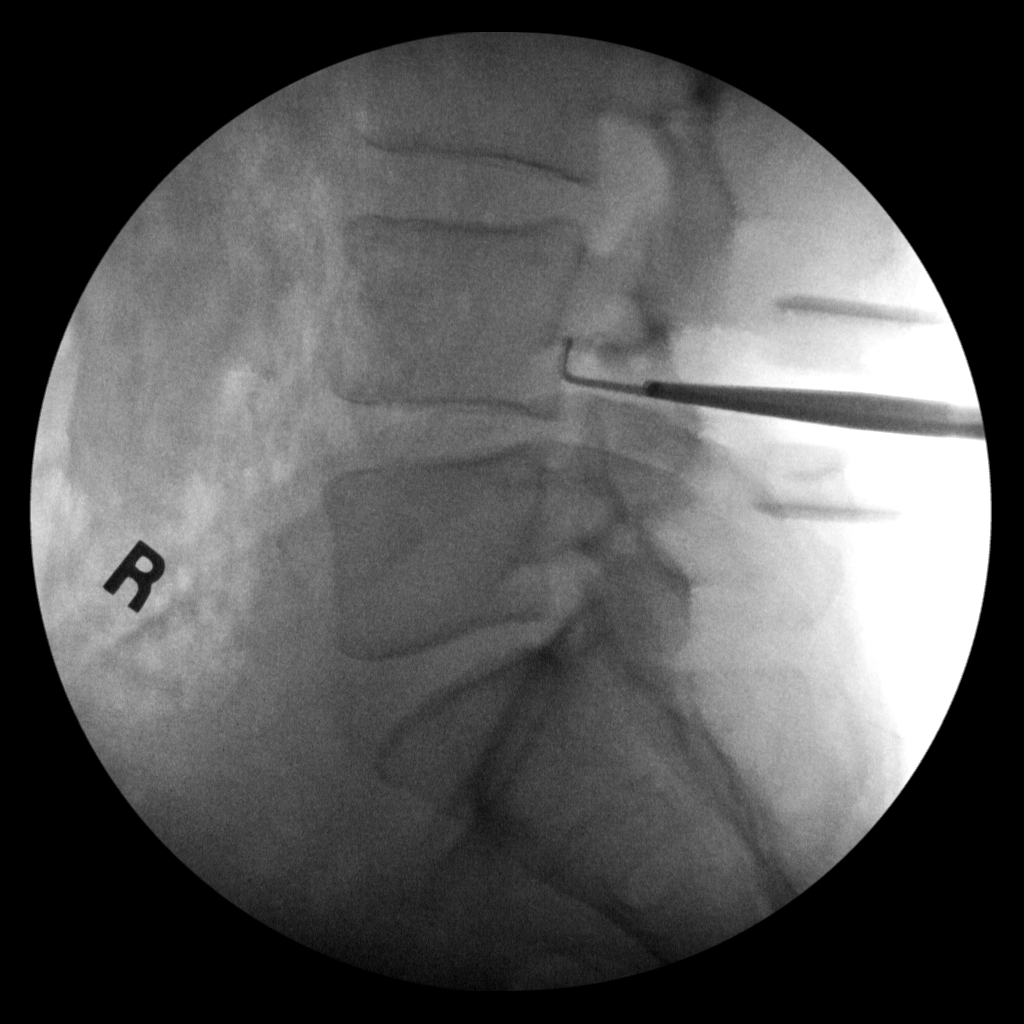
[im 2/3]
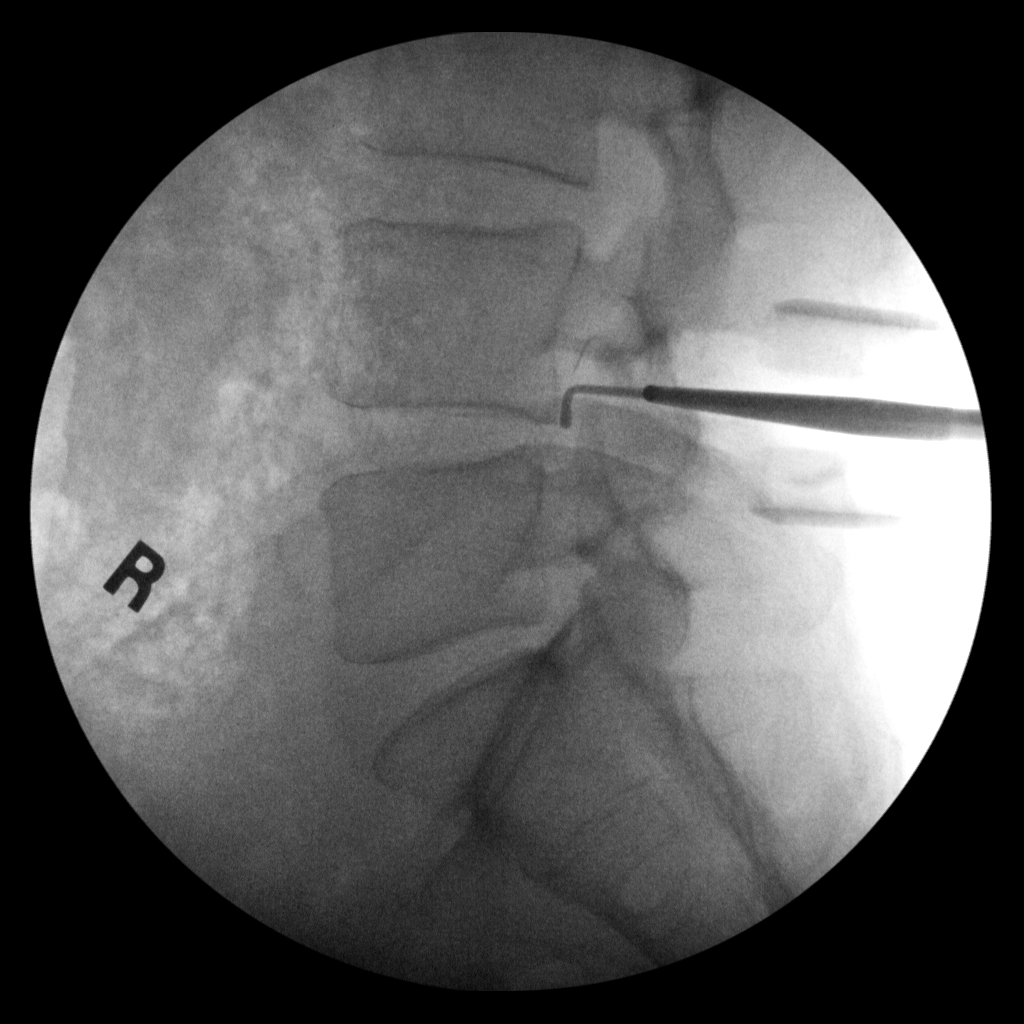
[im 3/3]
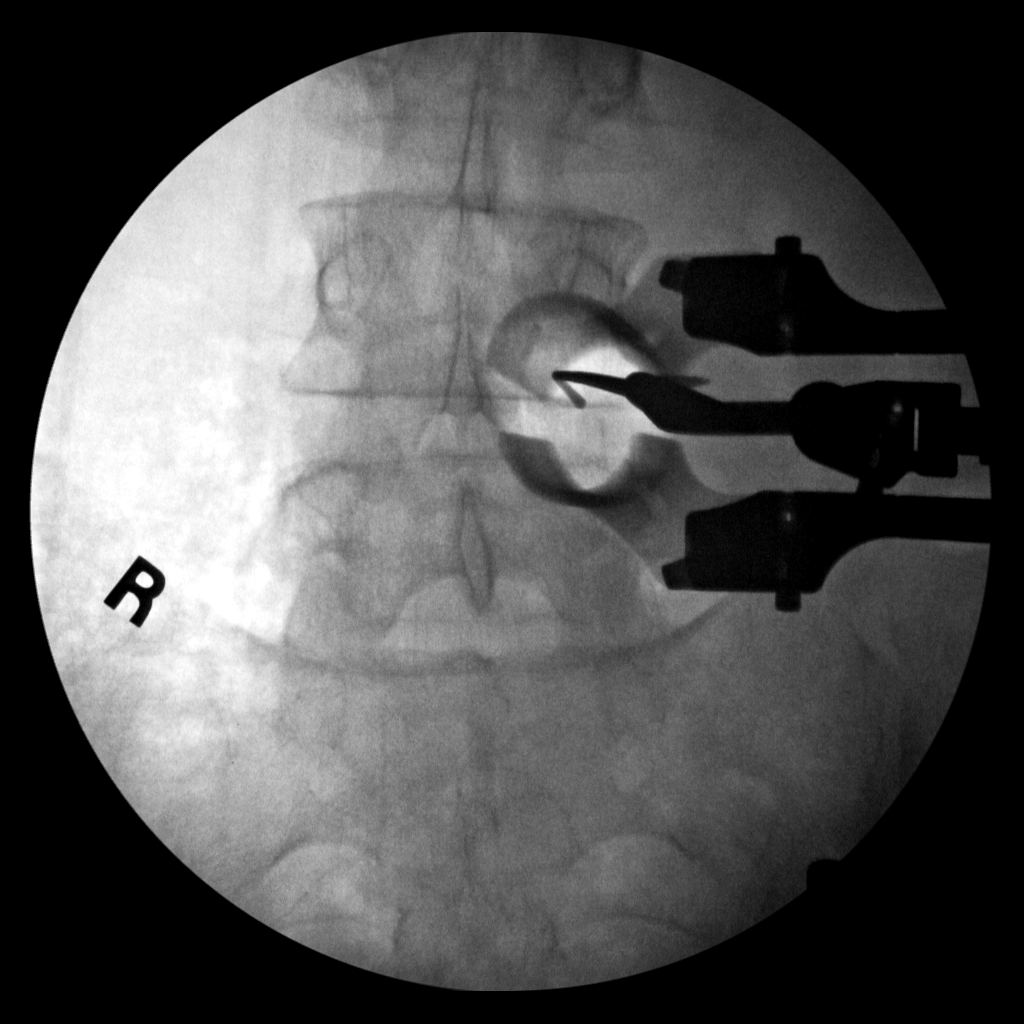

[3 of 3 positions shown; findings below may reference images not displayed]

FINDINGS: Exam presumptively labeled with 5 lumbar vertebra.

Correlation of this numbering system with that utilized on any prior
outside imaging recommended for consistency.

Image #1 at 8093 hours: Curved metallic probe via dorsal approach
projects dorsal to the inferior aspect of L4 vertebral body.

Image #2 at 8093 hours with tip of curved metallic probe projecting
dorsal to the superior aspect of the L4-L5 disc space.

Image #3 at 9988 hours: AP view demonstrating tip of curved metallic
probe projecting over the inferior endplate of L4.
IMPRESSION: Localization of the L4-L5 disc space level as above.

## 2020-11-08 ENCOUNTER — Telehealth: Payer: Self-pay | Admitting: Psychiatry

## 2020-11-08 ENCOUNTER — Telehealth (INDEPENDENT_AMBULATORY_CARE_PROVIDER_SITE_OTHER): Payer: 59 | Admitting: Psychiatry

## 2020-11-08 ENCOUNTER — Encounter: Payer: Self-pay | Admitting: Psychiatry

## 2020-11-08 DIAGNOSIS — F411 Generalized anxiety disorder: Secondary | ICD-10-CM | POA: Diagnosis not present

## 2020-11-08 DIAGNOSIS — F3342 Major depressive disorder, recurrent, in full remission: Secondary | ICD-10-CM | POA: Diagnosis not present

## 2020-11-08 MED ORDER — LAMOTRIGINE 150 MG PO TABS: 150.0000 mg | ORAL_TABLET | Freq: Every day | ORAL | 3 refills | Status: DC

## 2020-11-08 MED ORDER — ALPRAZOLAM 0.5 MG PO TABS: 0.5000 mg | ORAL_TABLET | Freq: Two times a day (BID) | ORAL | 1 refills | Status: DC | PRN

## 2020-11-08 NOTE — Telephone Encounter (Signed)
Mr. moroni, nester are scheduled for a virtual visit with your provider today.    Just as we do with appointments in the office, we must obtain your consent to participate.  Your consent will be active for this visit and any virtual visit you may have with one of our providers in the next 365 days.    If you have a MyChart account, I can also send a copy of this consent to you electronically.  All virtual visits are billed to your insurance company just like a traditional visit in the office.  As this is a virtual visit, video technology does not allow for your provider to perform a traditional examination.  This may limit your provider's ability to fully assess your condition.  If your provider identifies any concerns that need to be evaluated in person or the need to arrange testing such as labs, EKG, etc, we will make arrangements to do so.    Although advances in technology are sophisticated, we cannot ensure that it will always work on either your end or our end.  If the connection with a video visit is poor, we may have to switch to a telephone visit.  With either a video or telephone visit, we are not always able to ensure that we have a secure connection.   I need to obtain your verbal consent now.   Are you willing to proceed with your visit today?   KHALIK PEWITT has provided verbal consent on 11/08/2020 for a virtual visit (video or telephone).   Thayer Headings, PMHNP 11/08/2020  8:39 AM

## 2020-11-08 NOTE — Progress Notes (Signed)
BREKKEN BEACH [id number] May 29, 1956 65 y.o.  Virtual Visit via Video Note  I connected with pt @ on 11/08/20 at  8:30 AM EST by a video enabled telemedicine application and verified that I am speaking with the correct person using two identifiers.   I discussed the limitations of evaluation and management by telemedicine and the availability of in person appointments. The patient expressed understanding and agreed to proceed.  I discussed the assessment and treatment plan with the patient. The patient was provided an opportunity to ask questions and all were answered. The patient agreed with the plan and demonstrated an understanding of the instructions.   The patient was advised to call back or seek an in-person evaluation if the symptoms worsen or if the condition fails to improve as anticipated.  I provided 30 minutes of non-face-to-face time during this encounter.  The patient was located at home.  The provider was located at [place].   Thayer Headings, PMHNP   Subjective:   Patient ID:  Adrian Mclaughlin is a 66 y.o. (DOB 1956-07-09) male.  Chief Complaint:  Chief Complaint  Patient presents with  . Follow-up    History of mood disturbance, anxiety, and insomnia    HPI Adrian Mclaughlin presents for follow-up of history of mood disturbance, anxiety, and insomnia. He has relocated to [place]. He plans to retire April 1st. He reports "this has been a very stressful time" with move and decision to retire. He reports that his anxiety has improved since he moved. He reports that he had one panic attack in the middle of move and stress. Denies depression. He reports that his sleep varies and is "typical for me." He reports adequate sleep. Typically sleeps from 10 pm-7:30 am. Appetite has been good. Energy and motivation have been good except for his job. Concentration has been ok. He is enjoying things. Denies SI.   He reports that they are close to hiking trails and  plans to hike and ride his bike.   He reports that he has used Xanax infrequently, about twice to ensure adequate.   Past Psychiatric Medication Trials: Paxil- Has taken 40 mg long-term. Zoloft Cymbalta Prozac Effexor- helped Remeron Abilify-Akathisia Seroquel XR Lamictal Alprazolam Lunesta  Review of Systems:  Review of Systems  Musculoskeletal: Negative for gait problem.  Neurological: Negative for tremors.  Psychiatric/Behavioral:       Please refer to HPI    Medications: I have reviewed the patient's current medications.  Current Outpatient Medications  Medication Sig Dispense Refill  . PARoxetine (PAXIL) 40 MG tablet TAKE 1 AND 1/2 TABLETS BY  MOUTH DAILY 135 tablet 3  . QUEtiapine (SEROQUEL XR) 300 MG 24 hr tablet TAKE 1 TABLET BY MOUTH IN  THE EVENING DAILY AT 6 PM 90 tablet 3  . quinapril-hydrochlorothiazide (ACCURETIC) 20-25 MG tablet Take 1 tablet by mouth daily.    . tamsulosin (FLOMAX) 0.4 MG CAPS capsule Take 0.4 mg by mouth at bedtime. Reports taking every 2-3 nights.    . ALPRAZolam (XANAX) 0.5 MG tablet Take 1 tablet (0.5 mg total) by mouth 2 (two) times daily as needed. 60 tablet 1  . lamoTRIgine (LAMICTAL) 150 MG tablet Take 1 tablet (150 mg total) by mouth daily. 90 tablet 3   No current facility-administered medications for this visit.    Medication Side Effects: None  Allergies: No Known Allergies  Past Medical History:  Diagnosis Date  . Anxiety   . Depression   . History of  kidney stones   . Hypertension   . Mucoid cyst of joint    left long finger    Family History  Problem Relation Age of Onset  . Schizophrenia Maternal Aunt   . Autism Son   . Anxiety disorder Son   . Panic disorder Son     Social History   Socioeconomic History  . Marital status: Married    Spouse name: Not on file  . Number of children: Not on file  . Years of education: Not on file  . Highest education level: Not on file  Occupational History  . Not on  file  Tobacco Use  . Smoking status: Never Smoker  . Smokeless tobacco: Never Used  Vaping Use  . Vaping Use: Never used  Substance and Sexual Activity  . Alcohol use: Yes    Comment: 2-3 drinks a night  . Drug use: Never  . Sexual activity: Not on file  Other Topics Concern  . Not on file  Social History Narrative  . Not on file   Social Determinants of Health   Financial Resource Strain: Not on file  Food Insecurity: Not on file  Transportation Needs: Not on file  Physical Activity: Not on file  Stress: Not on file  Social Connections: Not on file  Intimate Partner Violence: Not on file    Past Medical History, Surgical history, Social history, and Family history were reviewed and updated as appropriate.   Please see review of systems for further details on the patient's review from today.   Objective:   Physical Exam:  There were no vitals taken for this visit.  Physical Exam Neurological:     Mental Status: He is alert and oriented to person, place, and time.     Cranial Nerves: No dysarthria.  Psychiatric:        Attention and Perception: Attention and perception normal.        Mood and Affect: Mood normal.        Speech: Speech normal.        Behavior: Behavior is cooperative.        Thought Content: Thought content normal. Thought content is not paranoid or delusional. Thought content does not include homicidal or suicidal ideation. Thought content does not include homicidal or suicidal plan.        Cognition and Memory: Cognition and memory normal.        Judgment: Judgment normal.     Comments: Insight intact     Lab Review:     Component Value Date/Time   NA 133 (L) 12/12/2019 1051   K 4.2 12/12/2019 1051   CL 95 (L) 12/12/2019 1051   CO2 29 12/12/2019 1051   GLUCOSE 103 (H) 12/12/2019 1051   BUN 22 12/12/2019 1051   CREATININE 1.26 (H) 12/12/2019 1051   CALCIUM 9.9 12/12/2019 1051   GFRNONAA 60 (L) 12/12/2019 1051   GFRAA >60 12/12/2019 1051        Component Value Date/Time   WBC 6.5 12/12/2019 1051   RBC 4.68 12/12/2019 1051   HGB 13.5 12/12/2019 1051   HCT 40.3 12/12/2019 1051   PLT 255 12/12/2019 1051   MCV 86.1 12/12/2019 1051   MCH 28.8 12/12/2019 1051   MCHC 33.5 12/12/2019 1051   RDW 12.4 12/12/2019 1051   LYMPHSABS 1.0 06/03/2007 1550   MONOABS 0.4 06/03/2007 1550   EOSABS 0.0 06/03/2007 1550   BASOSABS 0.0 06/03/2007 1550    No results found for: POCLITH,  LITHIUM   No results found for: PHENYTOIN, PHENOBARB, VALPROATE, CBMZ   .res Assessment: Plan:   Will continue current plan of care since target signs and symptoms are well controlled without any tolerability issues. Continue Seroquel XR 300 mg daily for mood stabilization. Continue lamotrigine 150 mg daily for mood signs and symptoms. Continue Paxil 60 mg daily as needed mood and anxiety. Continue Xanax as needed for anxiety.  Will send prescription to [facility] in [place] since patient has relocated. Patient to follow-up in 6 months or sooner if clinically indicated. Patient advised to contact office with any questions, adverse effects, or acute worsening in signs and symptoms.  Duffy was seen today for follow-up.  Diagnoses and all orders for this visit:  Major depressive disorder, recurrent episode, in full remission ([place]) -     lamoTRIgine (LAMICTAL) 150 MG tablet; Take 1 tablet (150 mg total) by mouth daily.  Anxiety state Comments: Stable. Chronic Orders: -     ALPRAZolam (XANAX) 0.5 MG tablet; Take 1 tablet (0.5 mg total) by mouth 2 (two) times daily as needed.     Please see After Visit Summary for patient specific instructions.  No future appointments.  No orders of the defined types were placed in this encounter.     -------------------------------

## 2020-12-01 ENCOUNTER — Other Ambulatory Visit: Payer: Self-pay | Admitting: Psychiatry

## 2020-12-01 DIAGNOSIS — F3342 Major depressive disorder, recurrent, in full remission: Secondary | ICD-10-CM

## 2021-04-16 DIAGNOSIS — I10 Essential (primary) hypertension: Secondary | ICD-10-CM | POA: Diagnosis not present

## 2021-04-19 DIAGNOSIS — I10 Essential (primary) hypertension: Secondary | ICD-10-CM | POA: Diagnosis not present

## 2021-04-25 DIAGNOSIS — I9589 Other hypotension: Secondary | ICD-10-CM | POA: Diagnosis not present

## 2021-04-25 DIAGNOSIS — M7711 Lateral epicondylitis, right elbow: Secondary | ICD-10-CM | POA: Diagnosis not present

## 2021-10-02 ENCOUNTER — Other Ambulatory Visit: Payer: Self-pay | Admitting: Psychiatry

## 2021-10-02 DIAGNOSIS — F411 Generalized anxiety disorder: Secondary | ICD-10-CM

## 2021-10-02 DIAGNOSIS — F3342 Major depressive disorder, recurrent, in full remission: Secondary | ICD-10-CM

## 2021-10-03 NOTE — Telephone Encounter (Signed)
Please schedule appt

## 2021-10-04 NOTE — Telephone Encounter (Signed)
Left message for pt to schedule

## 2021-11-11 ENCOUNTER — Encounter: Payer: Self-pay | Admitting: Psychiatry

## 2021-11-11 ENCOUNTER — Ambulatory Visit (INDEPENDENT_AMBULATORY_CARE_PROVIDER_SITE_OTHER): Payer: Medicare Other | Admitting: Psychiatry

## 2021-11-11 DIAGNOSIS — F411 Generalized anxiety disorder: Secondary | ICD-10-CM

## 2021-11-11 DIAGNOSIS — F3342 Major depressive disorder, recurrent, in full remission: Secondary | ICD-10-CM

## 2021-11-11 MED ORDER — ALPRAZOLAM 0.5 MG PO TABS: 0.5000 mg | ORAL_TABLET | Freq: Two times a day (BID) | ORAL | 1 refills | Status: DC | PRN

## 2021-11-11 MED ORDER — QUETIAPINE FUMARATE ER 300 MG PO TB24: ORAL_TABLET | ORAL | 1 refills | Status: DC

## 2021-11-11 MED ORDER — PAROXETINE HCL 40 MG PO TABS: 60.0000 mg | ORAL_TABLET | Freq: Every day | ORAL | 1 refills | Status: DC

## 2021-11-11 MED ORDER — LAMOTRIGINE 150 MG PO TABS: 150.0000 mg | ORAL_TABLET | Freq: Every day | ORAL | 1 refills | Status: DC
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
## 2022-03-29 ENCOUNTER — Other Ambulatory Visit: Payer: Self-pay | Admitting: Psychiatry

## 2022-03-29 DIAGNOSIS — F3342 Major depressive disorder, recurrent, in full remission: Secondary | ICD-10-CM

## 2022-06-11 ENCOUNTER — Other Ambulatory Visit: Payer: Self-pay | Admitting: Psychiatry

## 2022-06-11 DIAGNOSIS — F3342 Major depressive disorder, recurrent, in full remission: Secondary | ICD-10-CM

## 2022-06-12 ENCOUNTER — Other Ambulatory Visit: Payer: Self-pay | Admitting: Psychiatry

## 2022-06-12 DIAGNOSIS — F3342 Major depressive disorder, recurrent, in full remission: Secondary | ICD-10-CM

## 2022-06-12 NOTE — Telephone Encounter (Signed)
Please schedule appt

## 2022-06-13 NOTE — Telephone Encounter (Signed)
Please call patient to schedule an appt, was due in August.

## 2022-07-01 ENCOUNTER — Encounter: Payer: Self-pay | Admitting: Psychiatry

## 2022-07-01 ENCOUNTER — Telehealth (INDEPENDENT_AMBULATORY_CARE_PROVIDER_SITE_OTHER): Payer: Medicare Other | Admitting: Psychiatry

## 2022-07-01 DIAGNOSIS — F3342 Major depressive disorder, recurrent, in full remission: Secondary | ICD-10-CM | POA: Diagnosis not present

## 2022-07-01 DIAGNOSIS — F411 Generalized anxiety disorder: Secondary | ICD-10-CM | POA: Diagnosis not present

## 2022-07-01 MED ORDER — ALPRAZOLAM 0.5 MG PO TABS: 0.5000 mg | ORAL_TABLET | Freq: Two times a day (BID) | ORAL | 1 refills | Status: AC | PRN

## 2022-07-01 MED ORDER — PAROXETINE HCL 40 MG PO TABS: 60.0000 mg | ORAL_TABLET | Freq: Every day | ORAL | 1 refills | Status: DC

## 2022-07-01 NOTE — Progress Notes (Signed)
Adrian Mclaughlin [id number] 10/07/1955 66 y.o.  Virtual Visit via Video Note  I connected with pt @ on 07/01/22 at 10:00 AM EDT by a video enabled telemedicine application and verified that I am speaking with the correct person using two identifiers.   I discussed the limitations of evaluation and management by telemedicine and the availability of in person appointments. The patient expressed understanding and agreed to proceed.  I discussed the assessment and treatment plan with the patient. The patient was provided an opportunity to ask questions and all were answered. The patient agreed with the plan and demonstrated an understanding of the instructions.   The patient was advised to call back or seek an in-person evaluation if the symptoms worsen or if the condition fails to improve as anticipated.  I provided 25 minutes of non-face-to-face time during this encounter.  The patient was located at home.  The provider was located at [place].   Thayer Headings, PMHNP   Subjective:   Patient ID:  Adrian Mclaughlin is a 66 y.o. (DOB 1955-11-24) male.  Chief Complaint:  Chief Complaint  Patient presents with   Follow-up    Anxiety, depression, and insomnia    HPI Adrian Mclaughlin presents for follow-up of mood disturbance, anxiety, and insomnia. He has been diagnosed with Prostate Cancer. He reports that he elected to have the most aggressive procedure and had a radical prostatectomy for the best possible outcome. He reports that he had  some complications after surgery to include a PE, genital edema, a yeast infection, and urinary incontinence. He reports that he will have a surgery to help with urinary incontinence and circumcision. Device will be activated 6 weeks after the surgery.   He reports that he has been "a lot better than I thought." He reports that he has been coping with medical issues and has been using briefs to manage urinary incontinence. Denies any excessive  anxiety or panic. Denies depressed mood. He reports that his energy and motivation have been good. He has been enjoying astrophotgraphy. He reports that he is sleeping "a lot." Awakens 8-8:30 am and goes to bed 10-10:30 pm. Appetite has been fine. He reports adequate concentration and has been able to focus without difficulty. Denies SI.   Has continued to go caving several times. Has not been able to do some of his usual activities to include biking, motorcycling, or climbing with a harness. He has bought a [employer] with 4 wheels and is enjoying rides.   He has been having most of his treatments in [place], which is 1.5-2 hours away from his home. He also had to travel for pelvic floor PT.   Wife has started quilting and socializing some. Son with special needs continues to do the activities he typically does with staying in his room and playing video games online.   Has taking Alprazolam prn on rare occasions he has difficulty falling asleep when anxious. Alprazolam last filled 11/11/21.     Review of Systems:  Review of Systems  Genitourinary:        Incontinence  Musculoskeletal:  Negative for gait problem.  Psychiatric/Behavioral:         Please refer to HPI    Medications: I have reviewed the patient's current medications.  Current Outpatient Medications  Medication Sig Dispense Refill   ELIQUIS 5 MG TABS tablet Take 5 mg by mouth 2 (two) times daily.     hydrochlorothiazide (HYDRODIURIL) 25 MG tablet Take 1 tablet  by mouth daily.     lamoTRIgine (LAMICTAL) 150 MG tablet TAKE 1 TABLET BY MOUTH DAILY 90 tablet 0   lisinopril (ZESTRIL) 20 MG tablet Take 1 tablet by mouth daily.     QUEtiapine (SEROQUEL XR) 300 MG 24 hr tablet TAKE 1 TABLET BY MOUTH IN  THE EVENING DAILY AT 6 PM 90 tablet 1   ALPRAZolam (XANAX) 0.5 MG tablet Take 1 tablet (0.5 mg total) by mouth 2 (two) times daily as needed. 60 tablet 1   PARoxetine (PAXIL) 40 MG tablet Take 1.5 tablets (60 mg total) by mouth  daily. 135 tablet 1   No current facility-administered medications for this visit.    Medication Side Effects: None  Allergies: No Known Allergies  Past Medical History:  Diagnosis Date   Anxiety    Cancer ([place])    Depression    History of kidney stones    Hypertension    Mucoid cyst of joint    left long finger    Family History  Problem Relation Age of Onset   Schizophrenia Maternal Aunt    Autism Son    Anxiety disorder Son    Panic disorder Son     Social History   Socioeconomic History   Marital status: Married    Spouse name: Not on file   Number of children: Not on file   Years of education: Not on file   Highest education level: Not on file  Occupational History   Not on file  Tobacco Use   Smoking status: Never   Smokeless tobacco: Never  Vaping Use   Vaping Use: Never used  Substance and Sexual Activity   Alcohol use: Yes    Comment: 2-3 drinks a night   Drug use: Never   Sexual activity: Not on file  Other Topics Concern   Not on file  Social History Narrative   Not on file   Social Determinants of Health   Financial Resource Strain: Not on file  Food Insecurity: Not on file  Transportation Needs: Not on file  Physical Activity: Not on file  Stress: Not on file  Social Connections: Not on file  Intimate Partner Violence: Not on file    Past Medical History, Surgical history, Social history, and Family history were reviewed and updated as appropriate.   Please see review of systems for further details on the patient's review from today.   Objective:   Physical Exam:  Wt 180 lb (81.6 kg)   BMI 25.10 kg/m   Physical Exam Neurological:     Mental Status: He is alert and oriented to person, place, and time.     Cranial Nerves: No dysarthria.  Psychiatric:        Attention and Perception: Attention and perception normal.        Mood and Affect: Mood normal.        Speech: Speech normal.        Behavior: Behavior is cooperative.         Thought Content: Thought content normal. Thought content is not paranoid or delusional. Thought content does not include homicidal or suicidal ideation. Thought content does not include homicidal or suicidal plan.        Cognition and Memory: Cognition and memory normal.        Judgment: Judgment normal.     Comments: Insight intact     Lab Review:     Component Value Date/Time   NA 133 (L) 12/12/2019 1051  K 4.2 12/12/2019 1051   CL 95 (L) 12/12/2019 1051   CO2 29 12/12/2019 1051   GLUCOSE 103 (H) 12/12/2019 1051   BUN 22 12/12/2019 1051   CREATININE 1.26 (H) 12/12/2019 1051   CALCIUM 9.9 12/12/2019 1051   GFRNONAA 60 (L) 12/12/2019 1051   GFRAA >60 12/12/2019 1051       Component Value Date/Time   WBC 6.5 12/12/2019 1051   RBC 4.68 12/12/2019 1051   HGB 13.5 12/12/2019 1051   HCT 40.3 12/12/2019 1051   PLT 255 12/12/2019 1051   MCV 86.1 12/12/2019 1051   MCH 28.8 12/12/2019 1051   MCHC 33.5 12/12/2019 1051   RDW 12.4 12/12/2019 1051   LYMPHSABS 1.0 06/03/2007 1550   MONOABS 0.4 06/03/2007 1550   EOSABS 0.0 06/03/2007 1550   BASOSABS 0.0 06/03/2007 1550    No results found for: "POCLITH", "LITHIUM"   No results found for: "PHENYTOIN", "PHENOBARB", "VALPROATE", "CBMZ"   .res Assessment: Plan:    Pt seen for 25 minutes and time spent discussing significant changes to medical and surgical history. He reports that he feels he has been coping well with health issues overall and will contact office if this changes in the future. He reports that he would like to continue current medications without changes at this time.  Continue Paxil 60 mg daily for anxiety and depression.  Continue Lamictal 150 mg daily for depression.  Continue Seroquel XR 300 mg daily for depression.  Continue Xanax 0.5 mg po BID prn anxiety.  Pt to follow-up in 6 months or sooner if clinically indicated.  Patient advised to contact office with any questions, adverse effects, or acute  worsening in signs and symptoms.   Kadir was seen today for follow-up.  Diagnoses and all orders for this visit:  Anxiety state -     ALPRAZolam (XANAX) 0.5 MG tablet; Take 1 tablet (0.5 mg total) by mouth 2 (two) times daily as needed.  Major depressive disorder, recurrent episode, in full remission ([place]) -     PARoxetine (PAXIL) 40 MG tablet; Take 1.5 tablets (60 mg total) by mouth daily.     Please see After Visit Summary for patient specific instructions.  No future appointments.  No orders of the defined types were placed in this encounter.     -------------------------------

## 2022-08-16 ENCOUNTER — Other Ambulatory Visit: Payer: Self-pay | Admitting: Psychiatry

## 2022-08-16 DIAGNOSIS — F3342 Major depressive disorder, recurrent, in full remission: Secondary | ICD-10-CM

## 2022-09-26 NOTE — Telephone Encounter (Signed)
Pt was seen in Oct.  Notes say to have Follow up in 6 months and he has an appt in April.  Looks like this encounter needs to be closed.

## 2022-10-02 ENCOUNTER — Telehealth: Payer: Self-pay | Admitting: Psychiatry

## 2022-10-02 NOTE — Telephone Encounter (Signed)
Pt called at 1:10p.  Pls update his default pharmacy to [facility] due to his insurance changing.  Next appt 4/22

## 2022-10-02 NOTE — Telephone Encounter (Signed)
Pharmacy updated.

## 2022-10-13 ENCOUNTER — Telehealth: Payer: Self-pay | Admitting: Psychiatry

## 2022-10-13 ENCOUNTER — Other Ambulatory Visit: Payer: Self-pay

## 2022-10-13 DIAGNOSIS — F3342 Major depressive disorder, recurrent, in full remission: Secondary | ICD-10-CM

## 2022-10-13 MED ORDER — QUETIAPINE FUMARATE ER 300 MG PO TB24: ORAL_TABLET | ORAL | 1 refills | Status: DC

## 2022-10-13 NOTE — Telephone Encounter (Signed)
Patient with refill request for Seroquel '300mg'$ . States has new pharmacy and needs Seroquel sent to [facility]. Ph: [phone] Patient [phone] Appt 4/22

## 2022-10-13 NOTE — Telephone Encounter (Signed)
Rx sent 

## 2023-01-05 ENCOUNTER — Encounter: Payer: Self-pay | Admitting: Psychiatry

## 2023-01-05 ENCOUNTER — Telehealth (INDEPENDENT_AMBULATORY_CARE_PROVIDER_SITE_OTHER): Payer: Medicare HMO | Admitting: Psychiatry

## 2023-01-05 VITALS — Wt 180.0 lb

## 2023-01-05 DIAGNOSIS — F411 Generalized anxiety disorder: Secondary | ICD-10-CM

## 2023-01-05 DIAGNOSIS — F3342 Major depressive disorder, recurrent, in full remission: Secondary | ICD-10-CM | POA: Diagnosis not present

## 2023-01-05 MED ORDER — PAROXETINE HCL 40 MG PO TABS: 60.0000 mg | ORAL_TABLET | Freq: Every day | ORAL | 2 refills | Status: AC

## 2023-01-05 MED ORDER — LAMOTRIGINE 150 MG PO TABS: 150.0000 mg | ORAL_TABLET | Freq: Every day | ORAL | 2 refills | Status: AC

## 2023-01-05 MED ORDER — QUETIAPINE FUMARATE ER 300 MG PO TB24: ORAL_TABLET | ORAL | 2 refills | Status: DC

## 2023-01-05 NOTE — Progress Notes (Signed)
Adrian Mclaughlin [id number] [birth year] 67 y.o.  Virtual Visit via Video Note  I connected with pt @ on 01/05/23 at  8:30 AM EDT by a video enabled telemedicine application and verified that I am speaking with the correct person using two identifiers.   I discussed the limitations of evaluation and management by telemedicine and the availability of in person appointments. The patient expressed understanding and agreed to proceed.  I discussed the assessment and treatment plan with the patient. The patient was provided an opportunity to ask questions and all were answered. The patient agreed with the plan and demonstrated an understanding of the instructions.   The patient was advised to call back or seek an in-person evaluation if the symptoms worsen or if the condition fails to improve as anticipated.  I provided 25 minutes of non-face-to-face time during this encounter.  The patient was located at home.  The provider was located at [facility].   Corie Chiquito, PMHNP   Subjective:   Patient ID:  Adrian Mclaughlin is a 67 y.o. (DOB 1956-03-23) male.  Chief Complaint:  Chief Complaint  Patient presents with   Follow-up    Depression, anxiety    HPI Adrian Mclaughlin presents for follow-up of depression and anxiety. He reports that his implant is now working well. He continues to cope with incontinence and erectile dysfunction. He reports that his hobbies are brining him enjoyment.   He denies depressed mood. He reports that he had some worry and anticipatory anxiety about going through a very technical cave. He reports that his stamina is less for more strenuous physical activity. He reports that energy and motivation have been good. Physically active and walks daily. Denies difficulty with concentration. Able to focus on learning about telescopes and photography. Sleeping well. Appetite has been good. Denies SI.   Rarely using Alprazolam prn.  Denies any acute stressors.    Past Psychiatric Medication Trials: Paxil- Has taken 40 mg long-term. Zoloft Cymbalta Prozac Effexor- helped Remeron Abilify-Akathisia Seroquel XR Lamictal Alprazolam Lunesta   Review of Systems:  Review of Systems  Musculoskeletal:  Negative for gait problem.  Neurological:  Negative for tremors.  Psychiatric/Behavioral:         Please refer to HPI    Medications: I have reviewed the patient's current medications.  Current Outpatient Medications  Medication Sig Dispense Refill   lisinopril (ZESTRIL) 20 MG tablet Take 1 tablet by mouth daily.     ALPRAZolam (XANAX) 0.5 MG tablet Take 1 tablet (0.5 mg total) by mouth 2 (two) times daily as needed. 60 tablet 1   lamoTRIgine (LAMICTAL) 150 MG tablet Take 1 tablet (150 mg total) by mouth daily. 90 tablet 2   PARoxetine (PAXIL) 40 MG tablet Take 1.5 tablets (60 mg total) by mouth daily. 135 tablet 2   QUEtiapine (SEROQUEL XR) 300 MG 24 hr tablet TAKE 1 TABLET BY MOUTH IN THE  EVENING DAILY AT 6 PM 90 tablet 2   No current facility-administered medications for this visit.    Medication Side Effects: None  Allergies: No Known Allergies  Past Medical History:  Diagnosis Date   Anxiety    Cancer    Depression    History of kidney stones    Hypertension    Mucoid cyst of joint    left long finger    Family History  Problem Relation Age of Onset   Schizophrenia Maternal Aunt    Autism Son    Anxiety disorder Son  Panic disorder Son     Social History   Socioeconomic History   Marital status: Married    Spouse name: Not on file   Number of children: Not on file   Years of education: Not on file   Highest education level: Not on file  Occupational History   Not on file  Tobacco Use   Smoking status: Never   Smokeless tobacco: Never  Vaping Use   Vaping Use: Never used  Substance and Sexual Activity   Alcohol use: Yes    Comment: 2-3 drinks a night   Drug use: Never   Sexual activity: Not on file   Other Topics Concern   Not on file  Social History Narrative   Not on file   Social Determinants of Health   Financial Resource Strain: Not on file  Food Insecurity: Not on file  Transportation Needs: Not on file  Physical Activity: Not on file  Stress: Not on file  Social Connections: Not on file  Intimate Partner Violence: Not on file    Past Medical History, Surgical history, Social history, and Family history were reviewed and updated as appropriate.   Please see review of systems for further details on the patient's review from today.   Objective:   Physical Exam:  Wt 180 lb (81.6 kg)   BMI 25.10 kg/m   Physical Exam Constitutional:      General: He is not in acute distress. Musculoskeletal:        General: No deformity.  Neurological:     Mental Status: He is alert and oriented to person, place, and time.     Coordination: Coordination normal.  Psychiatric:        Attention and Perception: Attention and perception normal. He does not perceive auditory or visual hallucinations.        Mood and Affect: Mood normal. Mood is not anxious or depressed. Affect is not labile, blunt, angry or inappropriate.        Speech: Speech normal.        Behavior: Behavior normal.        Thought Content: Thought content normal. Thought content is not paranoid or delusional. Thought content does not include homicidal or suicidal ideation. Thought content does not include homicidal or suicidal plan.        Cognition and Memory: Cognition and memory normal.        Judgment: Judgment normal.     Comments: Insight intact    Lab Review:     Component Value Date/Time   NA 133 (L) 12/12/2019 1051   K 4.2 12/12/2019 1051   CL 95 (L) 12/12/2019 1051   CO2 29 12/12/2019 1051   GLUCOSE 103 (H) 12/12/2019 1051   BUN 22 12/12/2019 1051   CREATININE 1.26 (H) 12/12/2019 1051   CALCIUM 9.9 12/12/2019 1051   GFRNONAA 60 (L) 12/12/2019 1051   GFRAA >60 12/12/2019 1051       Component  Value Date/Time   WBC 6.5 12/12/2019 1051   RBC 4.68 12/12/2019 1051   HGB 13.5 12/12/2019 1051   HCT 40.3 12/12/2019 1051   PLT 255 12/12/2019 1051   MCV 86.1 12/12/2019 1051   MCH 28.8 12/12/2019 1051   MCHC 33.5 12/12/2019 1051   RDW 12.4 12/12/2019 1051   LYMPHSABS 1.0 06/03/2007 1550   MONOABS 0.4 06/03/2007 1550   EOSABS 0.0 06/03/2007 1550   BASOSABS 0.0 06/03/2007 1550    No results found for: "POCLITH", "LITHIUM"   No  results found for: "PHENYTOIN", "PHENOBARB", "VALPROATE", "CBMZ"   .res Assessment: Plan:   Will continue current plan of care since target signs and symptoms are well controlled without any tolerability issues. Continue Lamictal 150 mg po qd for mood.  Continue Paxil 60 mg po qd for anxiety and depression.  Continue Seroquel XR 300 mg daily in the evening for mood and anxiety symptoms.  Continue Alprazolam 0.5 mg po BID prn anxiety. He reports that he does not need a new script at this time due to adequate supply.  Pt to follow-up in 6 months or sooner if clinically indicated.  Patient advised to contact office with any questions, adverse effects, or acute worsening in signs and symptoms.   Adrian Mclaughlin was seen today for follow-up.  Diagnoses and all orders for this visit:  Major depressive disorder, recurrent episode, in full remission -     QUEtiapine (SEROQUEL XR) 300 MG 24 hr tablet; TAKE 1 TABLET BY MOUTH IN THE  EVENING DAILY AT 6 PM -     PARoxetine (PAXIL) 40 MG tablet; Take 1.5 tablets (60 mg total) by mouth daily. -     lamoTRIgine (LAMICTAL) 150 MG tablet; Take 1 tablet (150 mg total) by mouth daily.  Anxiety state     Please see After Visit Summary for patient specific instructions.  No future appointments.   No orders of the defined types were placed in this encounter.     -------------------------------

## 2023-03-09 ENCOUNTER — Telehealth: Payer: Self-pay

## 2023-03-09 NOTE — Telephone Encounter (Signed)
PRIOR AUTHORIZATION form received from [facility] for Paroxetine 40 mg #135/90 day. Form completed and faxed with office notes.

## 2023-03-10 ENCOUNTER — Telehealth: Payer: Self-pay | Admitting: Psychiatry

## 2023-03-10 DIAGNOSIS — F411 Generalized anxiety disorder: Secondary | ICD-10-CM

## 2023-03-10 DIAGNOSIS — F3342 Major depressive disorder, recurrent, in full remission: Secondary | ICD-10-CM

## 2023-03-10 MED ORDER — PAROXETINE HCL 40 MG PO TABS: 60.0000 mg | ORAL_TABLET | ORAL | 0 refills | Status: AC

## 2023-03-10 NOTE — Telephone Encounter (Signed)
Pt.notified

## 2023-03-10 NOTE — Telephone Encounter (Signed)
[facility] approved PAROXTINE tablet for pt 09/15/22-09/15/2023

## 2023-03-10 NOTE — Telephone Encounter (Signed)
Please let him know that a 30-day supply has been sent to the local [facility].

## 2023-03-10 NOTE — Telephone Encounter (Signed)
Pt called requesting RF Paroxetine 40 mg 135/90 day. [facility] Pt stated on hold. Looks like PA sent in 6/24. Only 1 week of med left.  Pt getting nervous. Ask for 30 day to [facility] local [facility] [address] [place] [id number].Just depends on PA?

## 2023-04-13 NOTE — Telephone Encounter (Signed)
Prior Approval received for Paroxetine-see other note

## 2023-05-25 LAB — EXTERNAL GENERIC LAB PROCEDURE: COLOGUARD: NEGATIVE

## 2023-07-29 ENCOUNTER — Encounter: Payer: Self-pay | Admitting: Psychiatry

## 2023-08-14 ENCOUNTER — Other Ambulatory Visit: Payer: Self-pay | Admitting: Psychiatry

## 2023-08-14 DIAGNOSIS — F3342 Major depressive disorder, recurrent, in full remission: Secondary | ICD-10-CM

## 2023-08-29 ENCOUNTER — Other Ambulatory Visit: Payer: Self-pay | Admitting: Psychiatry

## 2023-08-29 DIAGNOSIS — F411 Generalized anxiety disorder: Secondary | ICD-10-CM
